# Patient Record
Sex: Female | Born: 1975
Health system: Southern US, Community
[De-identification: ages and names within clinical notes are randomized; demographics above are authoritative.]

## PROBLEM LIST (undated history)

## (undated) DIAGNOSIS — C50919 Malignant neoplasm of unspecified site of unspecified female breast: Secondary | ICD-10-CM

## (undated) DIAGNOSIS — Z8781 Personal history of (healed) traumatic fracture: Secondary | ICD-10-CM

## (undated) DIAGNOSIS — M1990 Unspecified osteoarthritis, unspecified site: Secondary | ICD-10-CM

## (undated) HISTORY — DX: Personal history of (healed) traumatic fracture: Z87.81

## (undated) HISTORY — PX: LEG SURGERY: SHX1003

## (undated) HISTORY — DX: Malignant neoplasm of unspecified site of unspecified female breast: C50.919

## (undated) HISTORY — DX: Unspecified osteoarthritis, unspecified site: M19.90

---

## 2001-11-15 ENCOUNTER — Other Ambulatory Visit: Admission: RE | Admit: 2001-11-15 | Discharge: 2001-11-15 | Payer: Self-pay | Admitting: Obstetrics & Gynecology

## 2002-02-09 ENCOUNTER — Observation Stay (HOSPITAL_COMMUNITY): Admission: RE | Admit: 2002-02-09 | Discharge: 2002-02-10 | Payer: Self-pay | Admitting: Obstetrics & Gynecology

## 2002-05-04 ENCOUNTER — Inpatient Hospital Stay (HOSPITAL_COMMUNITY): Admission: AD | Admit: 2002-05-04 | Discharge: 2002-05-04 | Payer: Self-pay | Admitting: Obstetrics & Gynecology

## 2002-05-09 ENCOUNTER — Inpatient Hospital Stay (HOSPITAL_COMMUNITY): Admission: AD | Admit: 2002-05-09 | Discharge: 2002-05-11 | Payer: Self-pay | Admitting: *Deleted

## 2002-06-28 DIAGNOSIS — Z8781 Personal history of (healed) traumatic fracture: Secondary | ICD-10-CM

## 2002-06-28 HISTORY — DX: Personal history of (healed) traumatic fracture: Z87.81

## 2002-06-30 ENCOUNTER — Encounter: Admission: RE | Admit: 2002-06-30 | Discharge: 2002-07-30 | Payer: Self-pay | Admitting: *Deleted

## 2002-07-31 ENCOUNTER — Encounter: Admission: RE | Admit: 2002-07-31 | Discharge: 2002-08-30 | Payer: Self-pay | Admitting: *Deleted

## 2002-09-29 ENCOUNTER — Encounter: Admission: RE | Admit: 2002-09-29 | Discharge: 2002-10-29 | Payer: Self-pay | Admitting: *Deleted

## 2002-11-29 ENCOUNTER — Encounter: Admission: RE | Admit: 2002-11-29 | Discharge: 2002-12-29 | Payer: Self-pay | Admitting: *Deleted

## 2003-01-29 ENCOUNTER — Encounter: Admission: RE | Admit: 2003-01-29 | Discharge: 2003-02-28 | Payer: Self-pay | Admitting: *Deleted

## 2003-02-04 ENCOUNTER — Encounter: Payer: Self-pay | Admitting: Emergency Medicine

## 2003-02-04 ENCOUNTER — Inpatient Hospital Stay (HOSPITAL_COMMUNITY): Admission: EM | Admit: 2003-02-04 | Discharge: 2003-02-10 | Payer: Self-pay | Admitting: Emergency Medicine

## 2003-02-05 ENCOUNTER — Encounter: Payer: Self-pay | Admitting: Orthopedic Surgery

## 2003-03-01 ENCOUNTER — Encounter: Admission: RE | Admit: 2003-03-01 | Discharge: 2003-03-31 | Payer: Self-pay | Admitting: *Deleted

## 2003-05-01 ENCOUNTER — Encounter: Admission: RE | Admit: 2003-05-01 | Discharge: 2003-05-31 | Payer: Self-pay | Admitting: *Deleted

## 2006-09-10 ENCOUNTER — Emergency Department: Payer: Self-pay | Admitting: Emergency Medicine

## 2006-11-19 ENCOUNTER — Emergency Department: Payer: Self-pay | Admitting: Emergency Medicine

## 2008-08-26 ENCOUNTER — Emergency Department: Payer: Self-pay | Admitting: Emergency Medicine

## 2008-11-14 ENCOUNTER — Emergency Department: Payer: Self-pay | Admitting: Emergency Medicine

## 2009-06-21 ENCOUNTER — Emergency Department: Payer: Self-pay | Admitting: Emergency Medicine

## 2011-06-29 DIAGNOSIS — M1990 Unspecified osteoarthritis, unspecified site: Secondary | ICD-10-CM

## 2011-06-29 HISTORY — DX: Unspecified osteoarthritis, unspecified site: M19.90

## 2011-09-02 ENCOUNTER — Emergency Department: Payer: Self-pay | Admitting: Emergency Medicine

## 2011-09-02 LAB — TROPONIN I: Troponin-I: <0.02 ng/mL

## 2011-09-02 LAB — COMPREHENSIVE METABOLIC PANEL
Albumin: 3.6 g/dL (ref 3.4–5.0)
Alkaline Phosphatase: 82 U/L (ref 50–136)
Anion Gap: 9 (ref 7–16)
Bilirubin,Total: 0.6 mg/dL (ref 0.2–1.0)
Co2: 25 mmol/L (ref 21–32)
Creatinine: 0.86 mg/dL (ref 0.60–1.30)
Osmolality: 281 (ref 275–301)
Potassium: 3.8 mmol/L (ref 3.5–5.1)
Sodium: 142 mmol/L (ref 136–145)

## 2011-09-02 LAB — APTT: Activated PTT: 30.9 secs (ref 23.6–35.9)

## 2011-09-02 LAB — CBC
HGB: 13.5 g/dL (ref 12.0–16.0)
MCH: 29.5 pg (ref 26.0–34.0)
MCHC: 33.2 g/dL (ref 32.0–36.0)
MCV: 89 fL (ref 80–100)
Platelet: 227 10*3/uL (ref 150–440)
RBC: 4.59 10*6/uL (ref 3.80–5.20)

## 2011-09-02 LAB — PROTIME-INR: INR: 1.1

## 2011-09-16 ENCOUNTER — Ambulatory Visit: Payer: Self-pay | Admitting: Family Medicine

## 2012-03-18 ENCOUNTER — Ambulatory Visit: Payer: Self-pay | Admitting: Internal Medicine

## 2012-08-30 ENCOUNTER — Emergency Department: Payer: Self-pay | Admitting: Emergency Medicine

## 2015-04-29 ENCOUNTER — Encounter: Payer: Self-pay | Admitting: Emergency Medicine

## 2015-04-29 ENCOUNTER — Ambulatory Visit
Admission: EM | Admit: 2015-04-29 | Discharge: 2015-04-29 | Disposition: A | Discharge status: Home / Self Care | Payer: Commercial Indemnity | Attending: Family Medicine | Admitting: Family Medicine

## 2015-04-29 DIAGNOSIS — K047 Periapical abscess without sinus: Secondary | ICD-10-CM

## 2015-04-29 DIAGNOSIS — K0889 Other specified disorders of teeth and supporting structures: Secondary | ICD-10-CM

## 2015-04-29 MED ORDER — AMOXICILLIN-POT CLAVULANATE 875-125 MG PO TABS: 1.0000 | ORAL_TABLET | Freq: Two times a day (BID) | ORAL | Status: DC

## 2015-04-29 MED ORDER — TRAMADOL HCL 50 MG PO TABS: 50.0000 mg | ORAL_TABLET | Freq: Four times a day (QID) | ORAL | Status: DC | PRN

## 2015-04-29 MED ORDER — KETOROLAC TROMETHAMINE 60 MG/2ML IM SOLN
60.0000 mg | Freq: Once | INTRAMUSCULAR | Status: AC
  Administered 2015-04-29: 60 mg via INTRAMUSCULAR

## 2015-04-29 MED ORDER — MELOXICAM 15 MG PO TABS: 15.0000 mg | ORAL_TABLET | Freq: Every day | ORAL | Status: DC

## 2015-04-29 NOTE — ED Provider Notes (Signed)
CSN: [id number]     Arrival date & time 04/29/15  0813 History   First MD Initiated Contact with Patient 04/29/15 [phone]    Nurses notes were reviewed. Chief Complaint  Patient presents with  . Dental Pain   patient states she has seen a dentist in a few while  (Consider location/radiation/quality/duration/timing/severity/associated sxs/prior Treatment) Patient is a 39 y.o. female presenting with tooth pain. The history is provided by the patient. No language interpreter was used.  Dental Pain Location:  Lower Lower teeth location:  30/RL 1st molar Quality:  Aching, pulsating and shooting Severity:  Moderate Onset quality:  Sudden Relieved by:  None tried Worsened by:  Touching, cold food/drink and hot food/drink Ineffective treatments:  Acetaminophen Associated symptoms: facial pain, gum swelling and oral lesions   Associated symptoms: no congestion, no difficulty swallowing, no fever, no headaches, no neck pain, no neck swelling and no oral bleeding   Risk factors: no alcohol problem, no diabetes, no periodontal disease and no smoking     History reviewed. No pertinent past medical history. Past Surgical History  Procedure Laterality Date  . Leg surgery Left    History reviewed. No pertinent family history. Social History  Substance Use Topics  . Smoking status: Never Smoker   . Smokeless tobacco: Never Used  . Alcohol Use: Yes   OB History    No data available     Review of Systems  Constitutional: Negative for fever.  HENT: Positive for mouth sores. Negative for congestion.   Musculoskeletal: Negative for neck pain.  Neurological: Negative for headaches.  All other systems reviewed and are negative.   Allergies  Review of patient's allergies indicates no known allergies.  Home Medications   Prior to Admission medications   Medication Sig Start Date End Date Taking? Authorizing Provider  amoxicillin-clavulanate (AUGMENTIN) 875-125 MG tablet Take 1 tablet by  mouth 2 (two) times daily. 04/29/15   Frederich Cha, MD  meloxicam (MOBIC) 15 MG tablet Take 1 tablet (15 mg total) by mouth daily. 04/29/15   Frederich Cha, MD  traMADol (ULTRAM) 50 MG tablet Take 1 tablet (50 mg total) by mouth every 6 (six) hours as needed for moderate pain (may cause sedation if so would only take at night). 04/29/15   Frederich Cha, MD   Meds Ordered and Administered this Visit   Medications  ketorolac (TORADOL) injection 60 mg (60 mg Intramuscular Given 04/29/15 0927)    BP 140/88 mmHg  Pulse 89  Temp(Src) 98.6 F (37 C) (Tympanic)  Resp 16  Ht 5' (1.524 m)  Wt 256 lb (116.121 kg)  BMI 50.00 kg/m2  SpO2 100% No data found.   Physical Exam  Constitutional: She is oriented to person, place, and time. She appears well-developed.  HENT:  Head: Normocephalic and atraumatic.  Mouth/Throat: Oral lesions present. Abnormal dentition. Dental abscesses and dental caries present. No oropharyngeal exudate or posterior oropharyngeal edema.    Eyes: Conjunctivae are normal. Pupils are equal, round, and reactive to light.  Musculoskeletal: Normal range of motion.  Neurological: She is alert and oriented to person, place, and time.  Skin: Skin is warm.  Psychiatric: She has a normal mood and affect.  Vitals reviewed.   ED Course  Procedures (including critical care time)  Labs Review Labs Reviewed - No data to display  Imaging Review No results found.   Visual Acuity Review  Right Eye Distance:   Left Eye Distance:   Bilateral Distance:    Right Eye Near:  Left Eye Near:    Bilateral Near:         MDM   1. Pain, dental   2. Dental infection    For the dental pain is 8 out of 10 now we'll give 60 Toradol IM. Place on Augmentin 875 one tablet twice a day Mobic 15 mg and tramadol needed for pain. 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    Frederich Cha, MD 04/29/15 [phone]

## 2015-04-29 NOTE — Discharge Instructions (Signed)
Dental Care and Dentist Visits Dental care supports good overall health. Regular dental visits can also help you avoid dental pain, bleeding, infection, and other more serious health problems in the future. It is important to keep the mouth healthy because diseases in the teeth, gums, and other oral tissues can spread to other areas of the body. Some problems, such as diabetes, heart disease, and pre-term labor have been associated with poor oral health.  See your dentist every 6 months. If you experience emergency problems such as a toothache or broken tooth, go to the dentist right away. If you see your dentist regularly, you may catch problems early. It is easier to be treated for problems in the early stages.  WHAT TO EXPECT AT A DENTIST VISIT  Your dentist will look for many common oral health problems and recommend proper treatment. At your regular dental visit, you can expect:  Gentle cleaning of the teeth and gums. This includes scraping and polishing. This helps to remove the sticky substance around the teeth and gums (plaque). Plaque forms in the mouth shortly after eating. Over time, plaque hardens on the teeth as tartar. If tartar is not removed regularly, it can cause problems. Cleaning also helps remove stains.  Periodic X-rays. These pictures of the teeth and supporting bone will help your dentist assess the health of your teeth.  Periodic fluoride treatments. Fluoride is a natural mineral shown to help strengthen teeth. Fluoride treatmentinvolves applying a fluoride gel or varnish to the teeth. It is most commonly done in children.  Examination of the mouth, tongue, jaws, teeth, and gums to look for any oral health problems, such as:  Cavities (dental caries). This is decay on the tooth caused by plaque, sugar, and acid in the mouth. It is best to catch a cavity when it is small.  Inflammation of the gums caused by plaque buildup (gingivitis).  Problems with the mouth or malformed  or misaligned teeth.  Oral cancer or other diseases of the soft tissues or jaws. KEEP YOUR TEETH AND GUMS HEALTHY For healthy teeth and gums, follow these general guidelines as well as your dentist's specific advice:  Have your teeth professionally cleaned at the dentist every 6 months.  Brush twice daily with a fluoride toothpaste.  Floss your teeth daily.  Ask your dentist if you need fluoride supplements, treatments, or fluoride toothpaste.  Eat a healthy diet. Reduce foods and drinks with added sugar.  Avoid smoking. TREATMENT FOR ORAL HEALTH PROBLEMS If you have oral health problems, treatment varies depending on the conditions present in your teeth and gums.  Your caregiver will most likely recommend good oral hygiene at each visit.  For cavities, gingivitis, or other oral health disease, your caregiver will perform a procedure to treat the problem. This is typically done at a separate appointment. Sometimes your caregiver will refer you to another dental specialist for specific tooth problems or for surgery. SEEK IMMEDIATE DENTAL CARE IF:  You have pain, bleeding, or soreness in the gum, tooth, jaw, or mouth area.  A permanent tooth becomes loose or separated from the gum socket.  You experience a blow or injury to the mouth or jaw area.   This information is not intended to replace advice given to you by your health care provider. Make sure you discuss any questions you have with your health care provider.   Document Released: 02/24/2011 Document Revised: 09/06/2011 Document Reviewed: 02/24/2011 Elsevier Interactive Patient Education 2016 [place] [facility]  pain may be caused by many things, including:  Tooth decay (cavities or caries). Cavities cause the nerve of your tooth to be open to air and hot or cold temperatures. This can cause pain or discomfort.  Abscess or infection. A dental abscess is an area that is full of infected pus from a  bacterial infection in the inner part of the tooth (pulp). It usually happens at the end of the tooth's root.  Injury.  An unknown reason (idiopathic). Your pain may be mild or severe. It may only happen when:  You are chewing.  You are exposed to hot or cold temperature.  You are eating or drinking sugary foods or beverages, such as:  Soda.  Candy. Your pain may also be there all of the time. HOME CARE Watch your dental pain for any changes. Do these things to lessen your discomfort:  Take medicines only as told by your dentist.  If your dentist tells you to take an antibiotic medicine, finish all of it even if you start to feel better.  Keep all follow-up visits as told by your dentist. This is important.  Do not apply heat to the outside of your face.  Rinse your mouth or gargle with salt water if told by your dentist. This helps with pain and swelling.  You can make salt water by adding  tsp of salt to 1 cup of warm water.  Apply ice to the painful area of your face:  Put ice in a plastic bag.  Place a towel between your skin and the bag.  Leave the ice on for 20 minutes, 2-3 times per day.  Avoid foods or drinks that cause you pain, such as:  Very hot or very cold foods or drinks.  Sweet or sugary foods or drinks. GET HELP IF:  Your pain is not helped with medicines.  Your symptoms are worse.  You have new symptoms. GET HELP RIGHT AWAY IF:  You cannot open your mouth.  You are having trouble breathing or swallowing.  You have a fever.  Your face, neck, or jaw is puffy (swollen).   This information is not intended to replace advice given to you by your health care provider. Make sure you discuss any questions you have with your health care provider.   Document Released: 12/01/2007 Document Revised: 10/29/2014 Document Reviewed: 06/10/2014 Elsevier Interactive Patient Education [employer].

## 2015-04-29 NOTE — ED Notes (Signed)
Patient c/o toothache on her lower left side for the past 2 days.

## 2016-08-26 DIAGNOSIS — C50919 Malignant neoplasm of unspecified site of unspecified female breast: Secondary | ICD-10-CM

## 2016-08-26 HISTORY — DX: Malignant neoplasm of unspecified site of unspecified female breast: C50.919

## 2016-11-02 HISTORY — PX: BREAST LUMPECTOMY: SHX2

## 2017-01-07 ENCOUNTER — Encounter: Payer: Self-pay | Admitting: Internal Medicine

## 2017-01-07 ENCOUNTER — Ambulatory Visit (INDEPENDENT_AMBULATORY_CARE_PROVIDER_SITE_OTHER): Payer: 59 | Admitting: Internal Medicine

## 2017-01-07 DIAGNOSIS — Z8781 Personal history of (healed) traumatic fracture: Secondary | ICD-10-CM | POA: Diagnosis not present

## 2017-01-07 DIAGNOSIS — C50912 Malignant neoplasm of unspecified site of left female breast: Secondary | ICD-10-CM

## 2017-01-07 DIAGNOSIS — R238 Other skin changes: Secondary | ICD-10-CM

## 2017-01-07 MED ORDER — GENTAMICIN SULFATE 0.1 % EX CREA: 1.0000 "application " | TOPICAL_CREAM | Freq: Two times a day (BID) | CUTANEOUS | 0 refills | Status: DC

## 2017-01-07 NOTE — Progress Notes (Signed)
Pre-visit discussion using our clinic review tool. No additional management support is needed unless otherwise documented below in the visit note.  

## 2017-01-07 NOTE — Progress Notes (Signed)
Patient ID: Brittney Nash, female   DOB: 03-08-1976, 41 y.o.   MRN: [id number]   Subjective:    Patient ID: Brittney Nash, female    DOB: [birth year], 41 y.o.   MRN: [id number]  HPI  Patient here to establish care.  Has been diagnosed with breast cancer - 2018.  S/p lumpectomy 10/2016.  S/p XRT to complete next week.  Doing well.  Some fatigue.  Tries to stay active.  No chest pain.  No sob.  No acid reflux.  No abdominal pain.  Bowels moving.  Does have some changes on her skin beneath her left breast - from radiation.  Taking pain medication once a day for this.  Followed by radiation oncology.  Overall she feels she is doing well.  Has one son - age 68.  Just moved to [place].  Regular periods.  LMP 12/07/16.     Past Medical History:  Diagnosis Date  . Arthritis 2013  . Breast cancer ([place]) 08/26/2016  . History of femur fracture 2004   s/p surgery with rod placement   Past Surgical History:  Procedure Laterality Date  . BREAST LUMPECTOMY Left 11/02/2016  . LEG SURGERY Left    Family History  Problem Relation Age of Onset  . Arthritis Mother   . Heart disease Paternal Grandfather    Social History   Social History  . Marital status: Married    Spouse name: N/A  . Number of children: N/A  . Years of education: N/A   Social History Main Topics  . Smoking status: Never Smoker  . Smokeless tobacco: Never Used  . Alcohol use Yes     Comment: occ 1-2 times a month   . Drug use: No  . Sexual activity: Not Asked   Other Topics Concern  . None   Social History Narrative  . None    Outpatient Encounter Prescriptions as of 01/07/2017  Medication Sig  . clotrimazole-betamethasone (LOTRISONE) cream Apply to affected area 2 times daily  . ketoconazole (NIZORAL) 2 % cream Apply topically.  Marland Kitchen oxyCODONE-acetaminophen (PERCOCET/ROXICET) 5-325 MG tablet Take 1 tablet by mouth as needed.  . silver sulfADIAZINE (SILVADENE) 1 % cream Apply to affected area daily  . gentamicin cream  (GARAMYCIN) 0.1 % Apply 1 application topically 2 (two) times daily.  . [DISCONTINUED] amoxicillin-clavulanate (AUGMENTIN) 875-125 MG tablet Take 1 tablet by mouth 2 (two) times daily.  . [DISCONTINUED] meloxicam (MOBIC) 15 MG tablet Take 1 tablet (15 mg total) by mouth daily.  . [DISCONTINUED] traMADol (ULTRAM) 50 MG tablet Take 1 tablet (50 mg total) by mouth every 6 (six) hours as needed for moderate pain (may cause sedation if so would only take at night).   No facility-administered encounter medications on file as of 01/07/2017.     Review of Systems  Constitutional: Negative for appetite change and unexpected weight change.  HENT: Negative for congestion and sinus pressure.   Respiratory: Negative for cough, chest tightness and shortness of breath.   Cardiovascular: Negative for chest pain, palpitations and leg swelling.  Gastrointestinal: Negative for abdominal pain, diarrhea, nausea and vomiting.  Genitourinary: Negative for difficulty urinating and dysuria.  Musculoskeletal: Negative for back pain and joint swelling.  Skin: Negative for rash.       Skin irritation beneath the left breast.    Neurological: Negative for dizziness, light-headedness and headaches.  Psychiatric/Behavioral: Negative for agitation and dysphoric mood.       Objective:     Pulse recheck:  96  Physical Exam  Constitutional: She appears well-developed and well-nourished. No distress.  HENT:  Nose: Nose normal.  Mouth/Throat: Oropharynx is clear and moist.  Neck: Neck supple. No thyromegaly present.  Cardiovascular: Normal rate and regular rhythm.   Pulmonary/Chest: Breath sounds normal. No respiratory distress. She has no wheezes.  Abdominal: Soft. Bowel sounds are normal. There is no tenderness.  Musculoskeletal: She exhibits no edema or tenderness.  Lymphadenopathy:    She has no cervical adenopathy.  Skin: No rash noted. No erythema.  Skin irritation beneath the left breast.    Psychiatric:  She has a normal mood and affect. Her behavior is normal.    BP 110/68 (BP Location: Left Arm, Patient Position: Sitting, Cuff Size: Large)   Pulse (!) 115   Temp 99.1 F (37.3 C) (Oral)   Resp 12   Ht 5' (1.524 m)   Wt 282 lb 6.4 oz (128.1 kg)   SpO2 98%   BMI 55.15 kg/m  Wt Readings from Last 3 Encounters:  01/07/17 282 lb 6.4 oz (128.1 kg)  04/29/15 256 lb (116.1 kg)     Lab Results  Component Value Date   WBC 5.5 09/02/2011   HGB 13.5 09/02/2011   HCT 40.7 09/02/2011   PLT 227 09/02/2011   GLUCOSE 67 09/02/2011   ALT 27 09/02/2011   AST 19 09/02/2011   NA 142 09/02/2011   K 3.8 09/02/2011   CL 108 (H) 09/02/2011   CREATININE 0.86 09/02/2011   BUN 13 09/02/2011   CO2 25 09/02/2011   INR 1.1 09/02/2011       Assessment & Plan:   Problem List Items Addressed This Visit    Breast cancer ([place])    Diagnosed this year.  S/p lumpectomy.  Receiving XRT now.  Will complete next week.  Follow.  Being followed at [facility].        History of femur fracture    Occurred in 2004.  S/p surgery with rod placement.  Reports no residual problems.        Skin irritation    Skin irritation beneath the left breast.  Being followed by radiation oncology.  They are treating.            Einar Pheasant, MD

## 2017-01-09 ENCOUNTER — Encounter: Payer: Self-pay | Admitting: Internal Medicine

## 2017-01-09 DIAGNOSIS — R238 Other skin changes: Secondary | ICD-10-CM | POA: Insufficient documentation

## 2017-01-09 DIAGNOSIS — C50919 Malignant neoplasm of unspecified site of unspecified female breast: Secondary | ICD-10-CM | POA: Insufficient documentation

## 2017-01-09 DIAGNOSIS — Z8781 Personal history of (healed) traumatic fracture: Secondary | ICD-10-CM | POA: Insufficient documentation

## 2017-01-09 NOTE — Assessment & Plan Note (Signed)
Skin irritation beneath the left breast.  Being followed by radiation oncology.  They are treating.

## 2017-01-09 NOTE — Assessment & Plan Note (Signed)
Diagnosed this year.  S/p lumpectomy.  Receiving XRT now.  Will complete next week.  Follow.  Being followed at [facility].

## 2017-01-09 NOTE — Assessment & Plan Note (Signed)
Occurred in 2004.  S/p surgery with rod placement.  Reports no residual problems.

## 2017-02-16 ENCOUNTER — Ambulatory Visit (INDEPENDENT_AMBULATORY_CARE_PROVIDER_SITE_OTHER): Payer: 59 | Admitting: Internal Medicine

## 2017-02-16 ENCOUNTER — Encounter: Payer: Self-pay | Admitting: Internal Medicine

## 2017-02-16 VITALS — BP 110/76 | HR 94 | Temp 98.6°F | Resp 12 | Wt 277.6 lb

## 2017-02-16 DIAGNOSIS — L989 Disorder of the skin and subcutaneous tissue, unspecified: Secondary | ICD-10-CM

## 2017-02-16 DIAGNOSIS — K219 Gastro-esophageal reflux disease without esophagitis: Secondary | ICD-10-CM | POA: Diagnosis not present

## 2017-02-16 DIAGNOSIS — M79671 Pain in right foot: Secondary | ICD-10-CM

## 2017-02-16 DIAGNOSIS — C50912 Malignant neoplasm of unspecified site of left female breast: Secondary | ICD-10-CM | POA: Diagnosis not present

## 2017-02-16 MED ORDER — MUPIROCIN 2 % EX OINT: 1.0000 "application " | TOPICAL_OINTMENT | Freq: Two times a day (BID) | CUTANEOUS | 0 refills | Status: DC

## 2017-02-16 NOTE — Progress Notes (Signed)
Pre-visit discussion using our clinic review tool. No additional management support is needed unless otherwise documented below in the visit note.  

## 2017-02-16 NOTE — Progress Notes (Signed)
Patient ID: Brittney Nash, female   DOB: [birth year], 41 y.o.   MRN: [id number]   Subjective:    Patient ID: Brittney Nash, female    DOB: 09-21-1975, 42 y.o.   MRN: [id number]  HPI  Patient here as a work in appt.  She is concerned regarding a lesion on her right thigh and also some right foot/ankle pain.  She reports that over the last week, she noticed a small circular red lesion on her thigh.  No itching.  The redness has improved.  Scabbed area now.  No pain.  No other lesions.  She also reports noticing some pain in her right foot/ankle.  No injury.  No trauma.  Some popping when rotates her ankle.  No increased swelling.  No significant pain with walking.  She is s/p left lumpectomy for breast cancer.  Just had f/u.  Completed XRT.  Started on tamoxifen.  Just started this week.  States also started having increased acid reflux this week.  Some burning when eats.  Notices more when eating sauce.  No chest pain.  No sob.  No abdominal pain or cramping.  Bowels moving.  Is planning to f/u with PT for left arm lymphedema.     Past Medical History:  Diagnosis Date  . Arthritis 2013  . Breast cancer ([place]) 08/26/2016  . History of femur fracture 2004   s/p surgery with rod placement   Past Surgical History:  Procedure Laterality Date  . BREAST LUMPECTOMY Left 11/02/2016  . LEG SURGERY Left    Family History  Problem Relation Age of Onset  . Arthritis Mother   . Heart disease Paternal Grandfather    Social History   Social History  . Marital status: Married    Spouse name: N/A  . Number of children: N/A  . Years of education: N/A   Social History Main Topics  . Smoking status: Never Smoker  . Smokeless tobacco: Never Used  . Alcohol use Yes     Comment: occ 1-2 times a month   . Drug use: No  . Sexual activity: Not Asked   Other Topics Concern  . None   Social History Narrative  . None    Outpatient Encounter Prescriptions as of 02/16/2017  Medication Sig  .  clotrimazole-betamethasone (LOTRISONE) cream Apply to affected area 2 times daily  . gentamicin cream (GARAMYCIN) 0.1 % Apply 1 application topically 2 (two) times daily.  Marland Kitchen ketoconazole (NIZORAL) 2 % cream Apply topically.  Marland Kitchen oxyCODONE-acetaminophen (PERCOCET/ROXICET) 5-325 MG tablet Take by mouth. PRN  . silver sulfADIAZINE (SILVADENE) 1 % cream Apply to affected area daily  . tamoxifen (NOLVADEX) 20 MG tablet Take 20 mg by mouth.  . mupirocin ointment (BACTROBAN) 2 % Place 1 application into the nose 2 (two) times daily.   No facility-administered encounter medications on file as of 02/16/2017.     Review of Systems  Constitutional: Negative for appetite change and unexpected weight change.  HENT: Negative for congestion and sinus pressure.   Respiratory: Negative for cough, chest tightness and shortness of breath.   Cardiovascular: Negative for chest pain, palpitations and leg swelling.  Gastrointestinal: Negative for abdominal pain, diarrhea, nausea and vomiting.       Some acid reflux as outlined.   Genitourinary: Negative for difficulty urinating and dysuria.  Musculoskeletal: Negative for back pain and joint swelling.       Right foot/ankle pain as outlined.    Skin: Negative for color change and rash.  Circular skin lesion - thigh.   Neurological: Negative for dizziness and headaches.  Psychiatric/Behavioral: Negative for agitation and dysphoric mood.       Objective:    Physical Exam  Constitutional: She appears well-developed and well-nourished. No distress.  HENT:  Nose: Nose normal.  Mouth/Throat: Oropharynx is clear and moist.  Neck: Neck supple. No thyromegaly present.  Cardiovascular: Normal rate and regular rhythm.   Pulmonary/Chest: Breath sounds normal. No respiratory distress. She has no wheezes.  Abdominal: Soft. Bowel sounds are normal. There is no tenderness.  Musculoskeletal: She exhibits no edema or tenderness.  Increased pain - top of right foot.   No increased erythema.  No increased warmth.  No swelling extending up the leg.   Lymphadenopathy:    She has no cervical adenopathy.  Skin: No rash noted. No erythema.  Small slightly raised circular lesion - thigh.  Non tender.  No increased erythema.    Psychiatric: She has a normal mood and affect. Her behavior is normal.    BP 110/76 (BP Location: Left Arm, Patient Position: Sitting, Cuff Size: Large)   Pulse 94   Temp 98.6 F (37 C) (Oral)   Resp 12   Wt 277 lb 9.6 oz (125.9 kg)   SpO2 97%   BMI 54.22 kg/m  Wt Readings from Last 3 Encounters:  02/16/17 277 lb 9.6 oz (125.9 kg)  01/07/17 282 lb 6.4 oz (128.1 kg)  04/29/15 256 lb (116.1 kg)     Lab Results  Component Value Date   WBC 5.5 09/02/2011   HGB 13.5 09/02/2011   HCT 40.7 09/02/2011   PLT 227 09/02/2011   GLUCOSE 67 09/02/2011   ALT 27 09/02/2011   AST 19 09/02/2011   NA 142 09/02/2011   K 3.8 09/02/2011   CL 108 (H) 09/02/2011   CREATININE 0.86 09/02/2011   BUN 13 09/02/2011   CO2 25 09/02/2011   INR 1.1 09/02/2011       Assessment & Plan:   Problem List Items Addressed This Visit    Breast cancer ([place])    S/p lumpectomy.  Completed XRT.  Started on letrozole.  Followed by oncology.        Relevant Medications   tamoxifen (NOLVADEX) 20 MG tablet   GERD (gastroesophageal reflux disease)    Acid reflux as outlined.  Start zantac as directed.  Schedule f/u.  Notify me if persistent.         Other Visit Diagnoses    Skin lesion of right lower extremity    -  Primary   Skin lesion - right thigh.  bactroban as directed.  call with update over the next 1-2 weeks.  if persistent, will refer to dermatology.     Right foot pain       foot/ankle pain as outlined.  laced ankle support.  call with update over the next 1-2 weeks.         Einar Pheasant, MD

## 2017-02-16 NOTE — Patient Instructions (Signed)
Laced ankle support  Take zantac (ranitidine) 150mg  one per day.  Take 30 minutes before breakfast.    Call with update.

## 2017-02-17 ENCOUNTER — Encounter: Payer: Self-pay | Admitting: Internal Medicine

## 2017-02-17 DIAGNOSIS — K219 Gastro-esophageal reflux disease without esophagitis: Secondary | ICD-10-CM | POA: Insufficient documentation

## 2017-02-17 NOTE — Assessment & Plan Note (Signed)
Acid reflux as outlined.  Start zantac as directed.  Schedule f/u.  Notify me if persistent.

## 2017-02-17 NOTE — Assessment & Plan Note (Signed)
S/p lumpectomy.  Completed XRT.  Started on letrozole.  Followed by oncology.

## 2017-03-14 ENCOUNTER — Ambulatory Visit (INDEPENDENT_AMBULATORY_CARE_PROVIDER_SITE_OTHER): Payer: 59

## 2017-03-14 ENCOUNTER — Ambulatory Visit
Admission: EM | Admit: 2017-03-14 | Discharge: 2017-03-14 | Disposition: A | Discharge status: Home / Self Care | Payer: 59 | Attending: Family Medicine | Admitting: Family Medicine

## 2017-03-14 ENCOUNTER — Encounter: Payer: Self-pay | Admitting: *Deleted

## 2017-03-14 DIAGNOSIS — M25571 Pain in right ankle and joints of right foot: Secondary | ICD-10-CM | POA: Diagnosis not present

## 2017-03-14 DIAGNOSIS — M25572 Pain in left ankle and joints of left foot: Secondary | ICD-10-CM

## 2017-03-14 MED ORDER — NAPROXEN 500 MG PO TABS: 500.0000 mg | ORAL_TABLET | Freq: Two times a day (BID) | ORAL | 0 refills | Status: DC

## 2017-03-14 NOTE — ED Triage Notes (Signed)
Patient started having right ankle pain 2 weeks ago. Previous ankle injury reported. Present mechanism of injury unknown.

## 2017-03-14 NOTE — ED Provider Notes (Signed)
[facility]    CSN: [id number] Arrival date & time: 03/14/17  1406     History   Chief Complaint Chief Complaint  Patient presents with  . Ankle Pain    HPI Brittney Nash is a 41 y.o. female.   HPI  This is a 41 year old female who presents with left medial ankle pain that she's had for last 2 weeks. Denied injuring her ankle that she remembers. At injured it remotely several years ago but this healed without any sequelae. Now her pain is over the medial aspect and inferior to the medial malleolus. 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 She does not remember any twisting increase in activities other than standing.         Past Medical History:  Diagnosis Date  . Arthritis 2013  . Breast cancer ([place]) 08/26/2016  . History of femur fracture 2004   s/p surgery with rod placement    Patient Active Problem List   Diagnosis Date Noted  . GERD (gastroesophageal reflux disease) 02/17/2017  . Breast cancer ([place]) 01/09/2017  . Skin irritation 01/09/2017  . History of femur fracture 01/09/2017    Past Surgical History:  Procedure Laterality Date  . BREAST LUMPECTOMY Left 11/02/2016  . LEG SURGERY Left     OB History    No data available       Home Medications    Prior to Admission medications   Medication Sig Start Date End Date Taking? Authorizing Provider  tamoxifen (NOLVADEX) 20 MG tablet Take 20 mg by mouth. 02/10/17 02/10/18 Yes [provider]  clotrimazole-betamethasone (LOTRISONE) cream Apply to affected area 2 times daily 04/07/16 04/07/17  [provider]  gentamicin cream (GARAMYCIN) 0.1 % Apply 1 application topically 2 (two) times daily. 01/07/17   Einar Pheasant, MD  ketoconazole (NIZORAL) 2 % cream Apply topically. 04/07/16 04/07/17  [provider]  mupirocin ointment (BACTROBAN) 2 % Place 1 application into the nose 2 (two)  times daily. 02/16/17   Einar Pheasant, MD  naproxen (NAPROSYN) 500 MG tablet Take 1 tablet (500 mg total) by mouth 2 (two) times daily with a meal. 03/14/17   Lorin Picket, PA-C  oxyCODONE-acetaminophen (PERCOCET/ROXICET) 5-325 MG tablet Take by mouth. PRN    [provider]  silver sulfADIAZINE (SILVADENE) 1 % cream Apply to affected area daily 01/06/17 01/06/18  [provider]    Family History Family History  Problem Relation Age of Onset  . Arthritis Mother   . Heart disease Paternal Grandfather     Social History Social History  Substance Use Topics  . Smoking status: Never Smoker  . Smokeless tobacco: Never Used  . Alcohol use Yes     Comment: occ 1-2 times a month      Allergies   Patient has no known allergies.   Review of Systems Review of Systems  Constitutional: Positive for activity change. Negative for chills, fatigue and fever.  Musculoskeletal: Positive for arthralgias, gait problem and joint swelling.  All other systems reviewed and are negative.    Physical Exam Triage Vital Signs ED Triage Vitals  Enc Vitals Group     BP 03/14/17 1429 124/79     Pulse Rate 03/14/17 1429 84     Resp 03/14/17 1429 16     Temp 03/14/17 1429 98.6 F (37 C)     Temp Source 03/14/17 1429 Oral  SpO2 03/14/17 1429 100 %     Weight 03/14/17 1427 260 lb (117.9 kg)     Height 03/14/17 1427 4\' 11"  (1.499 m)     Head Circumference --      Peak Flow --      Pain Score 03/14/17 1427 4     Pain Loc --      Pain Edu? --      Excl. in [place]? --    No data found.   Updated Vital Signs BP 124/79 (BP Location: Right Arm)   Pulse 84   Temp 98.6 F (37 C) (Oral)   Resp 16   Ht 4\' 11"  (1.499 m)   Wt 260 lb (117.9 kg)   LMP 03/11/2017 (Exact Date) Comment: denies preg  SpO2 100%   BMI 52.51 kg/m   Visual Acuity Right Eye Distance:   Left Eye Distance:   Bilateral Distance:    Right Eye Near:   Left Eye Near:    Bilateral Near:     Physical  Exam  Constitutional: She is oriented to person, place, and time. She appears well-developed and well-nourished. No distress.  HENT:  Head: Normocephalic.  Eyes: Pupils are equal, round, and reactive to light.  Neck: Normal range of motion.  Musculoskeletal: Normal range of motion. She exhibits tenderness. She exhibits no deformity.  Examination of the right ankle shows some mild swelling over the medial ankle. There is no tenderness to compression of the tibia and fibula distally. She has a good range of motion of dorsiflexion plantar flexion and subtalar motion. Maximal tenderness is over the inferior portion of the medial malleolus and also inferior and slightly anterior in the deltoid ligament.  Neurological: She is alert and oriented to person, place, and time.  Skin: Skin is warm and dry. She is not diaphoretic.  Psychiatric: She has a normal mood and affect. Her behavior is normal. Judgment and thought content normal.  Nursing note and vitals reviewed.    UC Treatments / Results  Labs (all labs ordered are listed, but only abnormal results are displayed) Labs Reviewed - No data to display  EKG  EKG Interpretation None       Radiology Dg Ankle Complete Right  Result Date: 03/14/2017 CLINICAL DATA:  Acute right ankle pain without known injury. EXAM: RIGHT ANKLE - COMPLETE 3+ VIEW COMPARISON:  None. FINDINGS: There is no evidence of fracture, dislocation, or joint effusion. There is no evidence of arthropathy or other focal bone abnormality. Soft tissues are unremarkable. IMPRESSION: Normal right ankle. Electronically Signed   By: Marijo Conception, M.D.   On: 03/14/2017 15:08    Procedures Procedures (including critical care time)  Medications Ordered in UC Medications - No data to display   Initial Impression / Assessment and Plan / UC Course  I have reviewed the triage vital signs and the nursing notes.  Pertinent labs & imaging results that were available during my  care of the patient were reviewed by me and considered in my medical decision making (see chart for details).     Plan: 1. Test/x-ray results and diagnosis reviewed with patient 2. rx as per orders; risks, benefits, potential side effects reviewed with patient 3. Recommend supportive treatment with rest elevation and ice as necessary for pain. Use ankle stirrup for protection during active hours may take off at nighttime. Use Naprosyn twice a day with food if you're not improving follow-up with her primary care physician. 4. F/u prn if symptoms worsen  or don't improve   Final Clinical Impressions(s) / UC Diagnoses   Final diagnoses:  Acute left ankle pain    New Prescriptions Discharge Medication List as of 03/14/2017  3:21 PM    START taking these medications   Details  naproxen (NAPROSYN) 500 MG tablet Take 1 tablet (500 mg total) by mouth 2 (two) times daily with a meal., Starting Mon 03/14/2017, Normal         Controlled Substance Prescriptions [place] Controlled Substance Registry consulted? Not Applicable   Lorin Picket, PA-C 03/14/17 1530

## 2017-03-30 ENCOUNTER — Encounter: Payer: Self-pay | Admitting: Internal Medicine

## 2017-03-30 ENCOUNTER — Ambulatory Visit (INDEPENDENT_AMBULATORY_CARE_PROVIDER_SITE_OTHER): Payer: 59 | Admitting: Internal Medicine

## 2017-03-30 VITALS — BP 130/78 | HR 95 | Temp 98.6°F | Resp 14 | Ht 59.0 in | Wt 178.0 lb

## 2017-03-30 DIAGNOSIS — N644 Mastodynia: Secondary | ICD-10-CM | POA: Diagnosis not present

## 2017-03-30 DIAGNOSIS — F439 Reaction to severe stress, unspecified: Secondary | ICD-10-CM | POA: Diagnosis not present

## 2017-03-30 DIAGNOSIS — K219 Gastro-esophageal reflux disease without esophagitis: Secondary | ICD-10-CM | POA: Diagnosis not present

## 2017-03-30 DIAGNOSIS — C50912 Malignant neoplasm of unspecified site of left female breast: Secondary | ICD-10-CM

## 2017-03-30 LAB — CBC WITH DIFFERENTIAL/PLATELET
BASOS ABS: 0 10*3/uL (ref 0.0–0.1)
Basophils Relative: 0.4 % (ref 0.0–3.0)
Eosinophils Absolute: 0.1 10*3/uL (ref 0.0–0.7)
Eosinophils Relative: 1.8 % (ref 0.0–5.0)
HCT: 38.2 % (ref 36.0–46.0)
Hemoglobin: 12.6 g/dL (ref 12.0–15.0)
LYMPHS ABS: 1 10*3/uL (ref 0.7–4.0)
Lymphocytes Relative: 30.3 % (ref 12.0–46.0)
MCHC: 32.9 g/dL (ref 30.0–36.0)
MCV: 89.2 fl (ref 78.0–100.0)
MONO ABS: 0.4 10*3/uL (ref 0.1–1.0)
MONOS PCT: 11.7 % (ref 3.0–12.0)
NEUTROS ABS: 1.8 10*3/uL (ref 1.4–7.7)
NEUTROS PCT: 55.8 % (ref 43.0–77.0)
PLATELETS: 191 10*3/uL (ref 150.0–400.0)
RBC: 4.29 Mil/uL (ref 3.87–5.11)
RDW: 12.9 % (ref 11.5–15.5)
WBC: 3.3 10*3/uL — ABNORMAL LOW (ref 4.0–10.5)

## 2017-03-30 NOTE — Progress Notes (Signed)
Patient ID: Brittney Nash, female   DOB: 06-25-1976, 41 y.o.   MRN: [id number]   Subjective:    Patient ID: Brittney Nash, female    DOB: 05-23-1976, 41 y.o.   MRN: [id number]  HPI  Patient here for a scheduled follow up.  She reports she is doing relatively well.  Her main complaint is that of left breast tenderness and fullness.  States she wore a different bra recently.  Was wearing a support bra, but changed recently.  After this, she noticed some increased leftbreast fullness and tenderness.  Hurts to lie on that side.  No nodule.  No fever.  States feels good.  No joint aches or myalgias.  Increased stress.   Discussed with her today.  Some reactive depression at times.  Discussed medication.  She does not feel needed at this time.  Did request names of counselors.  Given.  No chest pain.  Breathing stable.  Was on zantac.  No acid reflux now.  Off zantac.  No abdominal pain.  Bowels moving.  Was seen for left ankle pain.  Xray negative for fracture.  Wearing a brace.  Takes occasional naprosyn.  Is some better.  Desires no further intervention at this time.     Past Medical History:  Diagnosis Date  . Arthritis 2013  . Breast cancer ([place]) 08/26/2016  . History of femur fracture 2004   s/p surgery with rod placement   Past Surgical History:  Procedure Laterality Date  . BREAST LUMPECTOMY Left 11/02/2016  . LEG SURGERY Left    Family History  Problem Relation Age of Onset  . Arthritis Mother   . Heart disease Paternal Grandfather    Social History   Social History  . Marital status: Married    Spouse name: N/A  . Number of children: N/A  . Years of education: N/A   Social History Main Topics  . Smoking status: Never Smoker  . Smokeless tobacco: Never Used  . Alcohol use Yes     Comment: occ 1-2 times a month   . Drug use: No  . Sexual activity: Not Asked   Other Topics Concern  . None   Social History Narrative  . None    Outpatient Encounter Prescriptions as of  03/30/2017  Medication Sig  . mupirocin ointment (BACTROBAN) 2 % Place 1 application into the nose 2 (two) times daily.  . naproxen (NAPROSYN) 500 MG tablet Take 1 tablet (500 mg total) by mouth 2 (two) times daily with a meal.  . silver sulfADIAZINE (SILVADENE) 1 % cream Apply to affected area daily  . tamoxifen (NOLVADEX) 20 MG tablet Take 20 mg by mouth.  . [DISCONTINUED] clotrimazole-betamethasone (LOTRISONE) cream Apply to affected area 2 times daily  . [DISCONTINUED] gentamicin cream (GARAMYCIN) 0.1 % Apply 1 application topically 2 (two) times daily.  . [DISCONTINUED] ketoconazole (NIZORAL) 2 % cream Apply topically.  . [DISCONTINUED] oxyCODONE-acetaminophen (PERCOCET/ROXICET) 5-325 MG tablet Take by mouth. PRN  . [DISCONTINUED] metroNIDAZOLE (FLAGYL) 500 MG tablet TAKE 1 TABLET (500 MG TOTAL) BY MOUTH TWO (2) TIMES A DAY. FOR 7 DAYS   No facility-administered encounter medications on file as of 03/30/2017.     Review of Systems  Constitutional: Negative for appetite change and unexpected weight change.  HENT: Negative for congestion and sinus pressure.   Respiratory: Negative for cough, chest tightness and shortness of breath.   Cardiovascular: Negative for chest pain, palpitations and leg swelling.  Gastrointestinal: Negative for abdominal pain, diarrhea,  nausea and vomiting.  Genitourinary: Negative for difficulty urinating and dysuria.  Musculoskeletal: Negative for back pain.       Left ankle pain - better.   Skin: Negative for color change and rash.       Left breast fullness and tenderness as outlined.  No redness.   Neurological: Negative for dizziness, light-headedness and headaches.  Psychiatric/Behavioral: Negative for agitation.       Increased stress as outlined.         Objective:    Physical Exam  Constitutional: She appears well-developed and well-nourished. No distress.  HENT:  Nose: Nose normal.  Mouth/Throat: Oropharynx is clear and moist.  Neck: Neck  supple. No thyromegaly present.  Cardiovascular: Normal rate and regular rhythm.   Pulmonary/Chest: Breath sounds normal. No respiratory distress. She has no wheezes.  Breast exam:  No nipple discharge or nipple retraction present.  Increased fullness and tenderness left breast (left upper inner quadrant).  No redness.  No axillary adenopathy.  Abdominal: Soft. Bowel sounds are normal. There is no tenderness.  Musculoskeletal: She exhibits no edema or tenderness.  Lymphadenopathy:    She has no cervical adenopathy.  Skin: No rash noted. No erythema.  Psychiatric: She has a normal mood and affect. Her behavior is normal.    BP 130/78 (BP Location: Left Arm, Patient Position: Sitting, Cuff Size: Large)   Pulse 95   Temp 98.6 F (37 C) (Oral)   Resp 14   Ht 4\' 11"  (1.499 m)   Wt 178 lb (80.7 kg)   LMP 03/11/2017 Comment: denies preg  SpO2 96%   BMI 35.95 kg/m  Wt Readings from Last 3 Encounters:  03/30/17 178 lb (80.7 kg)  03/14/17 260 lb (117.9 kg)  02/16/17 277 lb 9.6 oz (125.9 kg)     Lab Results  Component Value Date   WBC 3.3 (L) 03/30/2017   HGB 12.6 03/30/2017   HCT 38.2 03/30/2017   PLT 191.0 03/30/2017   GLUCOSE 67 09/02/2011   ALT 27 09/02/2011   AST 19 09/02/2011   NA 142 09/02/2011   K 3.8 09/02/2011   CL 108 (H) 09/02/2011   CREATININE 0.86 09/02/2011   BUN 13 09/02/2011   CO2 25 09/02/2011   INR 1.1 09/02/2011    Dg Ankle Complete Right  Result Date: 03/14/2017 CLINICAL DATA:  Acute right ankle pain without known injury. EXAM: RIGHT ANKLE - COMPLETE 3+ VIEW COMPARISON:  None. FINDINGS: There is no evidence of fracture, dislocation, or joint effusion. There is no evidence of arthropathy or other focal bone abnormality. Soft tissues are unremarkable. IMPRESSION: Normal right ankle. Electronically Signed   By: Marijo Conception, M.D.   On: 03/14/2017 15:08       Assessment & Plan:   Problem List Items Addressed This Visit    Breast cancer ([place]) - Primary     S/p lumpectomy.  Completed XRT.  Started on letrozole.  Followed by oncology.        Relevant Orders   CBC with Differential/Platelet (Completed)   Breast pain, left    Left breast fullness and pain as outlined.  Unclear etiology.  Has had some issues with lymphedema.  Wore a different bra.  Continue support bra.  Warm compresses.  Hold abx.  Discussed with her oncology dept.  They will see her tomorrow.        GERD (gastroesophageal reflux disease)    No acid reflux now.  Off zantac.  Stress    Increased stress as outlined.  Discussed with her today.  Names of counselors given.  Discussed medications.  She wants to hold on medication at this time.  Follow closely.            Einar Pheasant, MD

## 2017-03-31 ENCOUNTER — Other Ambulatory Visit: Payer: Self-pay | Admitting: Internal Medicine

## 2017-03-31 ENCOUNTER — Encounter: Payer: Self-pay | Admitting: Internal Medicine

## 2017-03-31 DIAGNOSIS — N644 Mastodynia: Secondary | ICD-10-CM | POA: Insufficient documentation

## 2017-03-31 DIAGNOSIS — D72819 Decreased white blood cell count, unspecified: Secondary | ICD-10-CM

## 2017-03-31 DIAGNOSIS — F439 Reaction to severe stress, unspecified: Secondary | ICD-10-CM | POA: Insufficient documentation

## 2017-03-31 NOTE — Assessment & Plan Note (Signed)
S/p lumpectomy.  Completed XRT.  Started on letrozole.  Followed by oncology.

## 2017-03-31 NOTE — Assessment & Plan Note (Signed)
Left breast fullness and pain as outlined.  Unclear etiology.  Has had some issues with lymphedema.  Wore a different bra.  Continue support bra.  Warm compresses.  Hold abx.  Discussed with her oncology dept.  They will see her tomorrow.

## 2017-03-31 NOTE — Progress Notes (Signed)
Order placed for f/u cbc.   

## 2017-03-31 NOTE — Assessment & Plan Note (Signed)
No acid reflux now.  Off zantac.

## 2017-03-31 NOTE — Assessment & Plan Note (Signed)
Increased stress as outlined.  Discussed with her today.  Names of counselors given.  Discussed medications.  She wants to hold on medication at this time.  Follow closely.

## 2017-04-08 ENCOUNTER — Other Ambulatory Visit: Payer: Self-pay

## 2017-04-26 ENCOUNTER — Other Ambulatory Visit (INDEPENDENT_AMBULATORY_CARE_PROVIDER_SITE_OTHER): Payer: 59

## 2017-04-26 DIAGNOSIS — D72819 Decreased white blood cell count, unspecified: Secondary | ICD-10-CM | POA: Diagnosis not present

## 2017-04-26 LAB — CBC WITH DIFFERENTIAL/PLATELET
BASOS ABS: 0 10*3/uL (ref 0.0–0.1)
Basophils Relative: 0.4 % (ref 0.0–3.0)
Eosinophils Absolute: 0.1 10*3/uL (ref 0.0–0.7)
Eosinophils Relative: 2 % (ref 0.0–5.0)
HCT: 39.4 % (ref 36.0–46.0)
HEMOGLOBIN: 12.8 g/dL (ref 12.0–15.0)
LYMPHS ABS: 1.1 10*3/uL (ref 0.7–4.0)
Lymphocytes Relative: 33.3 % (ref 12.0–46.0)
MCHC: 32.4 g/dL (ref 30.0–36.0)
MCV: 90.3 fl (ref 78.0–100.0)
MONO ABS: 0.4 10*3/uL (ref 0.1–1.0)
MONOS PCT: 11.7 % (ref 3.0–12.0)
NEUTROS PCT: 52.6 % (ref 43.0–77.0)
Neutro Abs: 1.8 10*3/uL (ref 1.4–7.7)
Platelets: 212 10*3/uL (ref 150.0–400.0)
RBC: 4.36 Mil/uL (ref 3.87–5.11)
RDW: 13.1 % (ref 11.5–15.5)
WBC: 3.4 10*3/uL — AB (ref 4.0–10.5)

## 2017-05-09 ENCOUNTER — Telehealth: Payer: Self-pay | Admitting: *Deleted

## 2017-05-09 NOTE — Telephone Encounter (Signed)
Copied from [place] [phone]. Topic: Appointment Scheduling - Scheduling Inquiry for Clinic >> May 09, 2017  3:45 PM Conception Chancy, NT wrote: Reason for CRM: pt is having right ankle pain, and reoccurring headaches she has already been to urgent care and they suggested she see her PCP. Dr. Nicki Reaper is booked out pretty far. She would like to know if she can be squeezed in. Please give pt a call back with an appt date.

## 2017-05-09 NOTE — Telephone Encounter (Signed)
Left message to return call to our office.  To get more information.  

## 2017-05-09 NOTE — Telephone Encounter (Signed)
Right ankle no injury ent to UC one month ago was given a brace and was told was inflammation in her right ankle gave Naproxen to take for pain and swelling. Re-occurring headaches for one week kind a comes and goes relieved with tylenol makes the headache tolerable  But does not get rid of the pain. Scheduled with NP Wednesday at 10:30 am.

## 2017-05-10 NOTE — Telephone Encounter (Signed)
Noted.  Let me know if I need to do anything more.  

## 2017-05-11 ENCOUNTER — Ambulatory Visit (INDEPENDENT_AMBULATORY_CARE_PROVIDER_SITE_OTHER): Payer: 59 | Admitting: Family Medicine

## 2017-05-11 ENCOUNTER — Encounter: Payer: Self-pay | Admitting: Family Medicine

## 2017-05-11 VITALS — BP 102/68 | HR 102 | Temp 98.6°F | Wt 278.4 lb

## 2017-05-11 DIAGNOSIS — M79671 Pain in right foot: Secondary | ICD-10-CM

## 2017-05-11 DIAGNOSIS — G44209 Tension-type headache, unspecified, not intractable: Secondary | ICD-10-CM

## 2017-05-11 MED ORDER — NAPROXEN 500 MG PO TABS: 500.0000 mg | ORAL_TABLET | Freq: Two times a day (BID) | ORAL | 0 refills | Status: DC

## 2017-05-11 NOTE — Patient Instructions (Signed)
Foot Pain Many things can cause foot pain. Some common causes are:  An injury.  A sprain.  Arthritis.  Blisters.  Bunions.  Follow these instructions at home: Pay attention to any changes in your symptoms. Take these actions to help with your discomfort:  If directed, put ice on the affected area: ? Put ice in a plastic bag. ? Place a towel between your skin and the bag. ? Leave the ice on for 15-20 minutes, 3?4 times a day for 2 days.  Take over-the-counter and prescription medicines only as told by your health care provider.  Wear comfortable, supportive shoes that fit you well. Do not wear high heels.  Do not stand or walk for long periods of time.  Do not lift a lot of weight. This can put added pressure on your feet.  Do stretches to relieve foot pain and stiffness as told by your health care provider.  Rub your foot gently.  Keep your feet clean and dry.  Contact a health care provider if:  Your pain does not get better after a few days of self-care.  Your pain gets worse.  You cannot stand on your foot. Get help right away if:  Your foot is numb or tingling.  Your foot or toes are swollen.  Your foot or toes turn white or blue.  You have warmth and redness along your foot. This information is not intended to replace advice given to you by your health care provider. Make sure you discuss any questions you have with your health care provider. Document Released: 07/11/2015 Document Revised: 11/20/2015 Document Reviewed: 07/10/2014 Elsevier Interactive Patient Education  [employer].   It was a pleasure to see you today!  Please elevate foot, use ice 15 minutes on and 15 minutes off in the evening. -continue wearing ankle support brace -naproxen two times daily with food  -supportive shoes should be worn; avoid flat shoes -Follow up if symptoms do not improve with treatment, worsen, or you develop new symptoms  Rest, increase fluids, and you  can use tylenol if needed for headache. If you experience headaches on a regular basis, please follow up for further evaluation and treatment.

## 2017-05-11 NOTE — Progress Notes (Signed)
Subjective:    Patient ID: Brittney Nash, female    DOB: 1975-08-10, 41 y.o.   MRN: [id number]  HPI  This 41 year old female presents today with ankle pain that has been present for 2 weeks.  She was evaluated for right medial ankle pain in the ED on 03/14/17. She reports a remote history of injury to this area that resolved spontaneously without sequale. She stands for extended periods of time with her job and this contributes to her pain. She was advised to use ice, elevation, ankle stirrup, and Naproxen for symptom relief. This  provided benefit. Image of right ankle was unremarkable.  She reports that the daily use of brace has provided benefit and she notes that discomfort occurs after removing it at night. She also reports that intermittent times of not using brace. She wears a slip on dress shoes that have some arch support. Pain is rated as 7 at the worse without a brace and with a brace no more than a 3. This is noted over medial ankle. She reports using naproxen every other day and will often alternate with tylenol/arthritis. This provides benefit for pain. She also states that she has not been consistent with bracing and naproxen use.  She has a PMH of Breast Cancer, arthritis, and history of femur fracture (2004).  Headache that occurs intermittently and is not present today however was present yesterday. She reports that stress is a trigger and she notes that her breast cancer follow up is this week and has increased her stress.  She is also working more with increased stressors noted at work. -Nausea/Vomiting: No -Fever: No -Weight Loss:No -History of CA/immunosuppresion:breast cancer -Visual Changes:No -Seizures: No  -previous treatments: Rest and tylenol improve headache when this occurs -sick contacts/travel/risks: None -Family Hx of headache: No   ROS: Positive for right foot pain. Denies fever, chills, sweats, thunderclap HA, chest pain, SOB, sinus tenderness/pressure,  ear pain. Denies numbness, tingling, or weakness. Denies depressed mood today; reports mild anxiety over upcoming appointment   Past Medical History:  Diagnosis Date  . Arthritis 2013  . Breast cancer ([place]) 08/26/2016  . History of femur fracture 2004   s/p surgery with rod placement     Social History   Socioeconomic History  . Marital status: Married    Spouse name: Not on file  . Number of children: Not on file  . Years of education: Not on file  . Highest education level: Not on file  Social Needs  . Financial resource strain: Not on file  . Food insecurity - worry: Not on file  . Food insecurity - inability: Not on file  . Transportation needs - medical: Not on file  . Transportation needs - non-medical: Not on file  Occupational History  . Not on file  Tobacco Use  . Smoking status: Never Smoker  . Smokeless tobacco: Never Used  Substance and Sexual Activity  . Alcohol use: Yes    Comment: occ 1-2 times a month   . Drug use: No  . Sexual activity: Not on file  Other Topics Concern  . Not on file  Social History Narrative  . Not on file    Past Surgical History:  Procedure Laterality Date  . BREAST LUMPECTOMY Left 11/02/2016  . LEG SURGERY Left     Family History  Problem Relation Age of Onset  . Arthritis Mother   . Heart disease Paternal Grandfather     No Known Allergies  Current Outpatient Medications on File Prior to Visit  Medication Sig Dispense Refill  . tamoxifen (NOLVADEX) 20 MG tablet Take 20 mg by mouth.     No current facility-administered medications on file prior to visit.     BP 102/68   Pulse (!) 102   Temp 98.6 F (37 C) (Oral)   Wt 278 lb 6.4 oz (126.3 kg)   LMP 05/09/2017 (Exact Date)   SpO2 97%   BMI 56.23 kg/m       Objective:   Physical Exam  Constitutional: She is oriented to person, place, and time. She appears well-developed and well-nourished.  Eyes: Pupils are equal, round, and reactive to light. No scleral  icterus.  Neck: Neck supple.  Cardiovascular: Normal rate, regular rhythm and intact distal pulses.  Pulmonary/Chest: Effort normal and breath sounds normal. She has no wheezes. She has no rales.  Abdominal: Soft. Bowel sounds are normal. There is no tenderness. There is no rebound.  Musculoskeletal: Normal range of motion.  Ankle: No visible erythema; mild swelling over medial right ankle. Range of motion is full in all directions. No discomfort noted with dorsiflexion and plantar flexion Strength is 5/5 in all directions. Able to walk without discomfort. She is wearing flip flops during the exam Mild tenderness noted over medial malleolus  Lymphadenopathy:    She has no cervical adenopathy.  Neurological: She is alert and oriented to person, place, and time. She has normal strength.  II-Visual fields grossly intact. III/IV/VI-Extraocular movements intact. Pupils reactive bilaterally. V/VII-Smile symmetric, equal eyebrow raise, facial sensation intact VIII- Hearing grossly intact XI-bilateral shoulder shrug XII-midline tongue extension Motor: 5/5 bilaterally with normal tone and bulk Ambulates with a coordinated gait   Skin: Skin is warm and dry. No rash noted.  Psychiatric: She has a normal mood and affect. Her behavior is normal. Judgment and thought content normal.       Assessment & Plan:  1. Right foot pain Symptom improves when patient is consistent with treatment. Advised consistent use elevation, rest, ice, ankle brace. Naproxen twice daily also provided and advised that she take this with food. Recent imaging was unremarkable. Advised her to follow up in one week if symptoms do not improve with consistent treatment. Further discussed the importance of consistent treatment and supportive footwear. Weight loss also encouraged and alternate periods of sitting/standing.   2. Tension-type headache, not intractable, unspecified chronicity pattern Headache improved and patient  has identified stressors that trigger headache. Neuro exam normal and no alarming factors noted in history or exam. Advised rest, increased fluids, and tylenol as needed. Further advised follow up if she experiences headaches on a regular basis and is treatment more than 10 days in one month; follow up is recommended for treatment.  Delano Metz, FNP-C

## 2017-05-13 ENCOUNTER — Telehealth: Payer: Self-pay | Admitting: Internal Medicine

## 2017-05-13 NOTE — Telephone Encounter (Unsigned)
Copied from [place] [id number]. Topic: Inquiry >> May 13, 2017  2:23 PM Malena Catholic I, NT wrote: Reason for CRM: Pt returning call for Doctor Nicki Reaper Nurse thanks

## 2017-05-13 NOTE — Telephone Encounter (Signed)
Patient called and said that she can not come in on Nov 20. I have scheduled her for 11/27 at 2:00

## 2017-05-13 NOTE — Telephone Encounter (Signed)
Patient was just seen on 11/14. Do you know what she is requesting?

## 2017-05-13 NOTE — Telephone Encounter (Signed)
Copied from [place] [id number]. Topic: Quick Communication - See Telephone Encounter >> May 13, 2017 10:44 AM Bea Graff, NT wrote: CRM for notification. See Telephone encounter for: 05/13/17. Pt will need the rx that Dr. Nicki Reaper had mentioned for anxiety. She uses [facility] in [place].

## 2017-05-13 NOTE — Telephone Encounter (Signed)
I last saw her on 03/30/17.  My note states we discussed medication, but she wanted to hold off.  Given she is having increased problems, I would like to see her.  See if she can come in on Tuesday (05/17/17) at 8:00.  There is a spot.  Can discuss starting medications then.  Confirm pt ok.

## 2017-05-13 NOTE — Telephone Encounter (Signed)
[email or web address].CRM started

## 2017-05-13 NOTE — Telephone Encounter (Signed)
Do not see any mention of medication for anxiety by Dr. Nicki Reaper.

## 2017-05-24 ENCOUNTER — Ambulatory Visit: Payer: 59 | Admitting: Internal Medicine

## 2017-06-01 ENCOUNTER — Telehealth: Payer: Self-pay | Admitting: *Deleted

## 2017-06-01 NOTE — Telephone Encounter (Signed)
Copied from [place] [phone]. Topic: Appointment Scheduling - Scheduling Inquiry for Clinic >> Jun 01, 2017 10:39 AM Aurelio Brash B wrote: Reason for CRM: Pt has an apt 11/27 to discuss anxiety meds with Dr Nicki Reaper, she had to cx the apt,  she called to r/s the apt  Im not seeing anything available until march,  I did not schedule an apt pt wants to get in sooner

## 2017-07-08 ENCOUNTER — Ambulatory Visit: Payer: 59 | Admitting: Internal Medicine

## 2017-07-08 DIAGNOSIS — Z0289 Encounter for other administrative examinations: Secondary | ICD-10-CM

## 2017-08-26 ENCOUNTER — Encounter: Payer: Self-pay | Admitting: Internal Medicine

## 2017-08-26 ENCOUNTER — Ambulatory Visit (INDEPENDENT_AMBULATORY_CARE_PROVIDER_SITE_OTHER): Payer: 59 | Admitting: Internal Medicine

## 2017-08-26 VITALS — BP 122/74 | HR 83 | Temp 98.4°F | Resp 18 | Wt 279.8 lb

## 2017-08-26 DIAGNOSIS — F439 Reaction to severe stress, unspecified: Secondary | ICD-10-CM | POA: Diagnosis not present

## 2017-08-26 DIAGNOSIS — C50912 Malignant neoplasm of unspecified site of left female breast: Secondary | ICD-10-CM

## 2017-08-26 DIAGNOSIS — R5383 Other fatigue: Secondary | ICD-10-CM | POA: Diagnosis not present

## 2017-08-26 DIAGNOSIS — Z13220 Encounter for screening for lipoid disorders: Secondary | ICD-10-CM

## 2017-08-26 LAB — LIPID PANEL
CHOLESTEROL: 146 mg/dL (ref 0–200)
HDL: 51.3 mg/dL (ref >39.00)
LDL CALC: 83 mg/dL (ref 0–99)
NonHDL: 94.45
Total CHOL/HDL Ratio: 3
Triglycerides: 56 mg/dL (ref 0.0–149.0)
VLDL: 11.2 mg/dL (ref 0.0–40.0)

## 2017-08-26 LAB — CBC WITH DIFFERENTIAL/PLATELET
BASOS ABS: 0 10*3/uL (ref 0.0–0.1)
BASOS PCT: 0.9 % (ref 0.0–3.0)
EOS ABS: 0.1 10*3/uL (ref 0.0–0.7)
Eosinophils Relative: 1.8 % (ref 0.0–5.0)
HCT: 37.6 % (ref 36.0–46.0)
Hemoglobin: 12.5 g/dL (ref 12.0–15.0)
Lymphocytes Relative: 30.8 % (ref 12.0–46.0)
Lymphs Abs: 1.2 10*3/uL (ref 0.7–4.0)
MCHC: 33.3 g/dL (ref 30.0–36.0)
MCV: 88.8 fl (ref 78.0–100.0)
MONO ABS: 0.4 10*3/uL (ref 0.1–1.0)
Monocytes Relative: 9.9 % (ref 3.0–12.0)
NEUTROS ABS: 2.2 10*3/uL (ref 1.4–7.7)
Neutrophils Relative %: 56.6 % (ref 43.0–77.0)
PLATELETS: 206 10*3/uL (ref 150.0–400.0)
RBC: 4.23 Mil/uL (ref 3.87–5.11)
RDW: 13.4 % (ref 11.5–15.5)
WBC: 3.9 10*3/uL — ABNORMAL LOW (ref 4.0–10.5)

## 2017-08-26 LAB — COMPREHENSIVE METABOLIC PANEL
ALT: 14 U/L (ref 0–35)
AST: 15 U/L (ref 0–37)
Albumin: 3.7 g/dL (ref 3.5–5.2)
Alkaline Phosphatase: 49 U/L (ref 39–117)
BUN: 11 mg/dL (ref 6–23)
CALCIUM: 9.4 mg/dL (ref 8.4–10.5)
CHLORIDE: 106 meq/L (ref 96–112)
CO2: 25 mEq/L (ref 19–32)
CREATININE: 0.76 mg/dL (ref 0.40–1.20)
GFR: 107.47 mL/min (ref >60.00)
Glucose, Bld: 83 mg/dL (ref 70–99)
Potassium: 4.2 mEq/L (ref 3.5–5.1)
Sodium: 137 mEq/L (ref 135–145)
Total Bilirubin: 0.7 mg/dL (ref 0.2–1.2)
Total Protein: 8 g/dL (ref 6.0–8.3)

## 2017-08-26 LAB — TSH: TSH: 1.79 u[IU]/mL (ref 0.35–4.50)

## 2017-08-26 MED ORDER — CITALOPRAM HYDROBROMIDE 20 MG PO TABS: 20.0000 mg | ORAL_TABLET | Freq: Every day | ORAL | 3 refills | Status: DC
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
## 2017-08-26 NOTE — Patient Instructions (Signed)
Increase citalopram to 20mg  per day.

## 2017-08-28 ENCOUNTER — Encounter: Payer: Self-pay | Admitting: Internal Medicine

## 2017-08-28 NOTE — Assessment & Plan Note (Signed)
Increased stress as outlined.  Discussed with her today.  On citalopram.  Increase citalopram to 20mg  q day.  Follow.

## 2017-08-28 NOTE — Assessment & Plan Note (Signed)
Increased fatigue, daytime somnolence and h/o snoring.  Discussed sleep study.  Order placed for split night sleep study.

## 2017-08-28 NOTE — Assessment & Plan Note (Signed)
S/p lumpectomy.  Completed XRT.  On tamoxifen.  Followed by oncology.

## 2017-09-19 ENCOUNTER — Telehealth: Payer: Self-pay | Admitting: *Deleted

## 2017-09-19 NOTE — Telephone Encounter (Signed)
Copied from [place] [phone]. Topic: Appointment Scheduling - Scheduling Inquiry for Clinic >> Sep 19, 2017 12:19 PM Arletha Grippe wrote: Reason for CRM: pt would like to be seen by Dr Nicki Reaper.  Pt was told by Dr Nicki Reaper to have the office work her in.  Please call [phone] . Pt is having breaking out in a rash and the cream the was prescribed is not helping.

## 2017-09-20 ENCOUNTER — Encounter: Payer: Self-pay | Admitting: Internal Medicine

## 2017-09-20 ENCOUNTER — Ambulatory Visit (INDEPENDENT_AMBULATORY_CARE_PROVIDER_SITE_OTHER): Payer: 59 | Admitting: Internal Medicine

## 2017-09-20 VITALS — BP 130/70 | HR 85 | Temp 98.4°F | Resp 16 | Wt 279.2 lb

## 2017-09-20 DIAGNOSIS — F439 Reaction to severe stress, unspecified: Secondary | ICD-10-CM

## 2017-09-20 DIAGNOSIS — N926 Irregular menstruation, unspecified: Secondary | ICD-10-CM | POA: Diagnosis not present

## 2017-09-20 DIAGNOSIS — C50912 Malignant neoplasm of unspecified site of left female breast: Secondary | ICD-10-CM | POA: Diagnosis not present

## 2017-09-20 DIAGNOSIS — R21 Rash and other nonspecific skin eruption: Secondary | ICD-10-CM

## 2017-09-20 LAB — HCG, QUANTITATIVE, PREGNANCY: Quantitative HCG: 22806 m[IU]/mL

## 2017-09-20 MED ORDER — TRIAMCINOLONE ACETONIDE 0.1 % EX CREA: 1.0000 "application " | TOPICAL_CREAM | Freq: Two times a day (BID) | CUTANEOUS | 0 refills | Status: DC

## 2017-09-20 NOTE — Progress Notes (Signed)
Patient ID: Brittney Nash, female   DOB: 06/08/1976, 42 y.o.   MRN: [id number]   Subjective:    Patient ID: Brittney Nash, female    DOB: 08-31-1975, 42 y.o.   MRN: [id number]  HPI  Patient here as a work in to discuss a persistent rash.  States first noticed rash in 2016-2017.  Was treated.  Resolved. Now has noticed return of rash.  Has been persistent.  Localized only in certain areas.  Circular scaling lesions over upper left leg, buttock and right side.  No facial or neck involvement.  No arm lesions.  No itching.  No fever or chills.  Eating and drinking well.  On citalopram.  Feels this working well for her.  Handling stress.  Mentioned at the end of the visit, she was concerned she may be pregnant.  Had unprotected intercourse.  No period this month.     Past Medical History:  Diagnosis Date  . Arthritis 2013  . Breast cancer ([place]) 08/26/2016  . History of femur fracture 2004   s/p surgery with rod placement   Past Surgical History:  Procedure Laterality Date  . BREAST LUMPECTOMY Left 11/02/2016  . LEG SURGERY Left    Family History  Problem Relation Age of Onset  . Arthritis Mother   . Heart disease Paternal Grandfather    Social History   Socioeconomic History  . Marital status: Married    Spouse name: Not on file  . Number of children: Not on file  . Years of education: Not on file  . Highest education level: Not on file  Occupational History  . Not on file  Social Needs  . Financial resource strain: Not on file  . Food insecurity:    Worry: Not on file    Inability: Not on file  . Transportation needs:    Medical: Not on file    Non-medical: Not on file  Tobacco Use  . Smoking status: Never Smoker  . Smokeless tobacco: Never Used  Substance and Sexual Activity  . Alcohol use: Yes    Comment: occ 1-2 times a month   . Drug use: No  . Sexual activity: Not on file  Lifestyle  . Physical activity:    Days per week: Not on file    Minutes per session: Not  on file  . Stress: Not on file  Relationships  . Social connections:    Talks on phone: Not on file    Gets together: Not on file    Attends religious service: Not on file    Active member of club or organization: Not on file    Attends meetings of clubs or organizations: Not on file    Relationship status: Not on file  Other Topics Concern  . Not on file  Social History Narrative  . Not on file    Outpatient Encounter Medications as of 09/20/2017  Medication Sig  . citalopram (CELEXA) 20 MG tablet Take 1 tablet (20 mg total) by mouth daily.  . tamoxifen (NOLVADEX) 20 MG tablet Take 20 mg by mouth.  . naproxen (NAPROSYN) 500 MG tablet Take 1 tablet (500 mg total) 2 (two) times daily with a meal by mouth. (Patient not taking: Reported on 09/20/2017)  . triamcinolone cream (KENALOG) 0.1 % Apply 1 application topically 2 (two) times daily.  . [DISCONTINUED] clotrimazole-betamethasone (LOTRISONE) cream APPLY TO AFFECTED AREA BID   No facility-administered encounter medications on file as of 09/20/2017.     Review  of Systems  Constitutional: Negative for appetite change and fever.  HENT: Negative for congestion and sinus pressure.   Respiratory: Negative for cough, chest tightness and shortness of breath.   Cardiovascular: Negative for chest pain and leg swelling.  Gastrointestinal: Negative for diarrhea, nausea and vomiting.  Musculoskeletal: Negative for joint swelling and myalgias.  Skin: Positive for rash. Negative for color change.  Neurological: Negative for dizziness and headaches.  Psychiatric/Behavioral: Negative for agitation and dysphoric mood.       Objective:    Physical Exam  Constitutional: She appears well-developed and well-nourished. No distress.  Neck: Neck supple. No thyromegaly present.  Cardiovascular: Normal rate and regular rhythm.  Pulmonary/Chest: Breath sounds normal. No respiratory distress. She has no wheezes.  Abdominal: Soft. Bowel sounds are  normal. There is no tenderness.  Musculoskeletal: She exhibits no edema or tenderness.  Lymphadenopathy:    She has no cervical adenopathy.  Skin: Rash noted. No erythema.  Circular scaling lesions - left buttock and left and right side.    Psychiatric: She has a normal mood and affect. Her behavior is normal.    BP 130/70 (BP Location: Left Arm, Patient Position: Sitting, Cuff Size: Large)   Pulse 85   Temp 98.4 F (36.9 C) (Oral)   Resp 16   Wt 279 lb 3.2 oz (126.6 kg)   SpO2 97%   BMI 56.39 kg/m  Wt Readings from Last 3 Encounters:  09/20/17 279 lb 3.2 oz (126.6 kg)  08/26/17 279 lb 12.8 oz (126.9 kg)  05/11/17 278 lb 6.4 oz (126.3 kg)     Lab Results  Component Value Date   WBC 3.9 (L) 08/26/2017   HGB 12.5 08/26/2017   HCT 37.6 08/26/2017   PLT 206.0 08/26/2017   GLUCOSE 83 08/26/2017   CHOL 146 08/26/2017   TRIG 56.0 08/26/2017   HDL 51.30 08/26/2017   LDLCALC 83 08/26/2017   ALT 14 08/26/2017   AST 15 08/26/2017   NA 137 08/26/2017   K 4.2 08/26/2017   CL 106 08/26/2017   CREATININE 0.76 08/26/2017   BUN 11 08/26/2017   CO2 25 08/26/2017   TSH 1.79 08/26/2017   INR 1.1 09/02/2011    Dg Ankle Complete Right  Result Date: 03/14/2017 CLINICAL DATA:  Acute right ankle pain without known injury. EXAM: RIGHT ANKLE - COMPLETE 3+ VIEW COMPARISON:  None. FINDINGS: There is no evidence of fracture, dislocation, or joint effusion. There is no evidence of arthropathy or other focal bone abnormality. Soft tissues are unremarkable. IMPRESSION: Normal right ankle. Electronically Signed   By: Marijo Conception, M.D.   On: 03/14/2017 15:08       Assessment & Plan:   Problem List Items Addressed This Visit    Breast cancer ([place])    On tamoxifen.  Followed by oncology.        Menstrual irregularity    Concern over possible pregnancy.  Check quantitative HCG.  If pregnant, she prefers referral to discuss options regarding abortion.        Rash - Primary     Persistent rash as outlined.  Trial of TCC .1%.  Refer to dermatology.       Relevant Orders   Ambulatory referral to Dermatology   Stress    Doing well on citalopram.  Follow.        Other Visit Diagnoses    Menstrual changes       Relevant Orders   B-HCG Quant (Completed)  Einar Pheasant, MD

## 2017-09-20 NOTE — Telephone Encounter (Signed)
Patient scheduled with Dr. Nicki Reaper today,

## 2017-09-21 ENCOUNTER — Ambulatory Visit: Payer: 59 | Admitting: Internal Medicine

## 2017-09-21 NOTE — Telephone Encounter (Signed)
Copied from [place] [phone]. Topic: Appointment Scheduling - Scheduling Inquiry for Clinic >> Sep 19, 2017 12:19 PM Arletha Grippe wrote: Reason for CRM: pt would like to be seen by Dr Nicki Reaper.  Pt was told by Dr Nicki Reaper to have the office work her in.  Please call [phone] . Pt is having breaking out in a rash and the cream the was prescribed is not helping.   >> Sep 21, 2017  4:52 PM Cleaster Corin, NT wrote: Pt. Calling stating that Dr. Nicki Reaper has called and pt. Wasn't by her phone. Pt. Calling about cream that hasn't worked for her rash and also stated needing lab results. No crm or notes put in for NT to read results.

## 2017-09-21 NOTE — Telephone Encounter (Signed)
Can you please schedule patient?      Thank you

## 2017-09-22 ENCOUNTER — Other Ambulatory Visit: Payer: Self-pay | Admitting: Internal Medicine

## 2017-09-22 DIAGNOSIS — Z3A01 Less than 8 weeks gestation of pregnancy: Secondary | ICD-10-CM

## 2017-09-22 NOTE — Telephone Encounter (Signed)
Pt has already been seen.Larena Glassman will be calling her to give lab results

## 2017-09-22 NOTE — Progress Notes (Signed)
Order placed for gyn referral.  

## 2017-09-23 ENCOUNTER — Encounter: Payer: Self-pay | Admitting: Internal Medicine

## 2017-09-23 DIAGNOSIS — N926 Irregular menstruation, unspecified: Secondary | ICD-10-CM | POA: Insufficient documentation

## 2017-09-23 DIAGNOSIS — R21 Rash and other nonspecific skin eruption: Secondary | ICD-10-CM | POA: Insufficient documentation

## 2017-09-23 NOTE — Assessment & Plan Note (Signed)
Doing well on citalopram.  Follow.   

## 2017-09-23 NOTE — Assessment & Plan Note (Signed)
Concern over possible pregnancy.  Check quantitative HCG.  If pregnant, she prefers referral to discuss options regarding abortion.

## 2017-09-23 NOTE — Assessment & Plan Note (Signed)
Persistent rash as outlined.  Trial of TCC .1%.  Refer to dermatology.

## 2017-09-23 NOTE — Assessment & Plan Note (Signed)
On tamoxifen.  Followed by oncology.   

## 2017-11-18 ENCOUNTER — Encounter: Payer: Self-pay | Admitting: Internal Medicine

## 2017-11-22 ENCOUNTER — Ambulatory Visit (INDEPENDENT_AMBULATORY_CARE_PROVIDER_SITE_OTHER): Payer: 59 | Admitting: Family Medicine

## 2017-11-22 ENCOUNTER — Encounter: Payer: Self-pay | Admitting: Family Medicine

## 2017-11-22 VITALS — BP 118/84 | HR 87 | Temp 98.7°F | Resp 16 | Wt 287.4 lb

## 2017-11-22 DIAGNOSIS — M5432 Sciatica, left side: Secondary | ICD-10-CM

## 2017-11-22 MED ORDER — CYCLOBENZAPRINE HCL 5 MG PO TABS: 5.0000 mg | ORAL_TABLET | Freq: Three times a day (TID) | ORAL | 0 refills | Status: DC | PRN

## 2017-11-22 MED ORDER — PREDNISONE 10 MG PO TABS: ORAL_TABLET | ORAL | 0 refills | Status: DC
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
## 2017-11-22 NOTE — Patient Instructions (Addendum)
Please take medication as directed and follow up if symptoms are not improving, worsen, or new symptoms develop.   See below for use of ice and heat.  Sciatica Sciatica is pain, numbness, weakness, or tingling along your sciatic nerve. The sciatic nerve starts in the lower back and goes down the back of each leg. Sciatica happens when this nerve is pinched or has pressure put on it. Sciatica usually goes away on its own or with treatment. Sometimes, sciatica may keep coming back (recur). Follow these instructions at home: Medicines  Take over-the-counter and prescription medicines only as told by your doctor.  Do not drive or use heavy machinery while taking prescription pain medicine. Managing pain  If directed, put ice on the affected area. ? Put ice in a plastic bag. ? Place a towel between your skin and the bag. ? Leave the ice on for 20 minutes, 2-3 times a day.  After icing, apply heat to the affected area before you exercise or as often as told by your doctor. Use the heat source that your doctor tells you to use, such as a moist heat pack or a heating pad. ? Place a towel between your skin and the heat source. ? Leave the heat on for 20-30 minutes. ? Remove the heat if your skin turns bright red. This is especially important if you are unable to feel pain, heat, or cold. You may have a greater risk of getting burned. Activity  Return to your normal activities as told by your doctor. Ask your doctor what activities are safe for you. ? Avoid activities that make your sciatica worse.  Take short rests during the day. Rest in a lying or standing position. This is usually better than sitting to rest. ? When you rest for a long time, do some physical activity or stretching between periods of rest. ? Avoid sitting for a long time without moving. Get up and move around at least one time each hour.  Exercise and stretch regularly, as told by your doctor.  Do not lift anything that is  heavier than 10 lb (4.5 kg) while you have symptoms of sciatica. ? Avoid lifting heavy things even when you do not have symptoms. ? Avoid lifting heavy things over and over.  When you lift objects, always lift in a way that is safe for your body. To do this, you should: ? Bend your knees. ? Keep the object close to your body. ? Avoid twisting. General instructions  Use good posture. ? Avoid leaning forward when you are sitting. ? Avoid hunching over when you are standing.  Stay at a healthy weight.  Wear comfortable shoes that support your feet. Avoid wearing high heels.  Avoid sleeping on a mattress that is too soft or too hard. You might have less pain if you sleep on a mattress that is firm enough to support your back.  Keep all follow-up visits as told by your doctor. This is important. Contact a doctor if:  You have pain that: ? Wakes you up when you are sleeping. ? Gets worse when you lie down. ? Is worse than the pain you have had in the past. ? Lasts longer than 4 weeks.  You lose weight for without trying. Get help right away if:  You cannot control when you pee (urinate) or poop (have a bowel movement).  You have weakness in any of these areas and it gets worse. ? Lower back. ? Lower belly (pelvis). ?  Butt (buttocks). ? Legs.  You have redness or swelling of your back.  You have a burning feeling when you pee. This information is not intended to replace advice given to you by your health care provider. Make sure you discuss any questions you have with your health care provider. Document Released: 03/23/2008 Document Revised: 11/20/2015 Document Reviewed: 02/21/2015 Elsevier Interactive Patient Education  [employer].

## 2018-01-01 ENCOUNTER — Other Ambulatory Visit: Payer: Self-pay | Admitting: Family Medicine

## 2018-01-01 DIAGNOSIS — M5432 Sciatica, left side: Secondary | ICD-10-CM

## 2018-01-12 ENCOUNTER — Ambulatory Visit: Payer: 59 | Admitting: Internal Medicine

## 2018-01-24 ENCOUNTER — Encounter: Payer: Self-pay | Admitting: Internal Medicine

## 2018-01-25 NOTE — Telephone Encounter (Signed)
Can we complete this form without an office visit or does she need to be scheduled?

## 2018-01-25 NOTE — Telephone Encounter (Signed)
Please schedule her f/u appt before 02/26/18.  Thanks.

## 2018-01-25 NOTE — Telephone Encounter (Signed)
Pt scheduled  

## 2018-01-25 NOTE — Telephone Encounter (Signed)
LMTCB

## 2018-02-05 ENCOUNTER — Other Ambulatory Visit: Payer: Self-pay | Admitting: Family Medicine

## 2018-02-05 DIAGNOSIS — M5432 Sciatica, left side: Secondary | ICD-10-CM

## 2018-02-06 NOTE — Telephone Encounter (Signed)
Rx request  Patient was seen by Brittney Nash on 11-22-17 for back pain This was prescribed at that visit 11-22-17 Patient has appt with Dr. Nicki Reaper on 02-10-18

## 2018-02-08 MED ORDER — CYCLOBENZAPRINE HCL 5 MG PO TABS: 5.0000 mg | ORAL_TABLET | Freq: Every day | ORAL | 0 refills | Status: DC | PRN

## 2018-02-08 NOTE — Telephone Encounter (Signed)
I have sent in the prescription for flexeril #30 with no refills.  Please notify pt that if she continues to have problems, can refer to PT or can reevaluate.  Just let us know.

## 2018-02-08 NOTE — Telephone Encounter (Signed)
LMTCB

## 2018-02-08 NOTE — Telephone Encounter (Signed)
OK to send in refill? Patient has an appt with you on 02/10/18

## 2018-02-08 NOTE — Telephone Encounter (Signed)
Need to clarify if she still needs and how often she is taking.

## 2018-02-08 NOTE — Telephone Encounter (Signed)
Pt is only taking at night as needed. She does not take it every night. She has had the script since May and just recently ran out.

## 2018-02-09 NOTE — Telephone Encounter (Signed)
Left message for patient to call back if needed, let her know that rx was refilled she is coming in tomorrow

## 2018-02-10 ENCOUNTER — Ambulatory Visit (INDEPENDENT_AMBULATORY_CARE_PROVIDER_SITE_OTHER): Payer: 59 | Admitting: Internal Medicine

## 2018-02-10 ENCOUNTER — Encounter: Payer: Self-pay | Admitting: Internal Medicine

## 2018-02-10 VITALS — BP 112/80 | HR 108 | Temp 98.7°F | Ht 59.0 in | Wt 287.6 lb

## 2018-02-10 DIAGNOSIS — F439 Reaction to severe stress, unspecified: Secondary | ICD-10-CM

## 2018-02-10 DIAGNOSIS — Z0289 Encounter for other administrative examinations: Secondary | ICD-10-CM

## 2018-02-10 DIAGNOSIS — M5432 Sciatica, left side: Secondary | ICD-10-CM

## 2018-02-10 DIAGNOSIS — C50912 Malignant neoplasm of unspecified site of left female breast: Secondary | ICD-10-CM

## 2018-02-10 NOTE — Progress Notes (Signed)
Patient ID: Brittney Nash, female   DOB: 1976-03-03, 42 y.o.   MRN: [id number]   Subjective:    Patient ID: Brittney Nash, female    DOB: [birth year], 42 y.o.   MRN: [id number]  HPI  Patient here for a scheduled follow up.  Also wants form completed.  Has been having problems with left sciatic pain.  Started PT yesterday.  Tries to stay ctive.  No chest pain.  No sob.  No acid reflux.  No abdominal pain.  Bowels moving.  Due to f/u at [facility] 03/02/18 - regarding her history of breast cancer.  Overall she feels she is handling stress relatively well.  On celexa.  Does not feel needs any further intervention.     Past Medical History:  Diagnosis Date  . Arthritis 2013  . Breast cancer ([place]) 08/26/2016  . History of femur fracture 2004   s/p surgery with rod placement   Past Surgical History:  Procedure Laterality Date  . BREAST LUMPECTOMY Left 11/02/2016  . LEG SURGERY Left    Family History  Problem Relation Age of Onset  . Arthritis Mother   . Heart disease Paternal Grandfather    Social History   Socioeconomic History  . Marital status: Married    Spouse name: Not on file  . Number of children: Not on file  . Years of education: Not on file  . Highest education level: Not on file  Occupational History  . Not on file  Social Needs  . Financial resource strain: Not on file  . Food insecurity:    Worry: Not on file    Inability: Not on file  . Transportation needs:    Medical: Not on file    Non-medical: Not on file  Tobacco Use  . Smoking status: Never Smoker  . Smokeless tobacco: Never Used  Substance and Sexual Activity  . Alcohol use: Yes    Comment: occ 1-2 times a month   . Drug use: No  . Sexual activity: Not on file  Lifestyle  . Physical activity:    Days per week: Not on file    Minutes per session: Not on file  . Stress: Not on file  Relationships  . Social connections:    Talks on phone: Not on file    Gets together: Not on file    Attends religious  service: Not on file    Active member of club or organization: Not on file    Attends meetings of clubs or organizations: Not on file    Relationship status: Not on file  Other Topics Concern  . Not on file  Social History Narrative  . Not on file    Outpatient Encounter Medications as of 02/10/2018  Medication Sig  . citalopram (CELEXA) 20 MG tablet Take 1 tablet (20 mg total) by mouth daily.  . cyclobenzaprine (FLEXERIL) 5 MG tablet Take 1 tablet (5 mg total) by mouth daily as needed for muscle spasms.  . [EXPIRED] tamoxifen (NOLVADEX) 20 MG tablet Take 20 mg by mouth.  . [DISCONTINUED] triamcinolone cream (KENALOG) 0.1 % Apply 1 application topically 2 (two) times daily.  . predniSONE (DELTASONE) 10 MG tablet Take 4 tablets once daily for 2 days, 3 tabs daily for 2 days, 2 tabs daily for 2 days, 1 tab daily for 2 days. (Patient not taking: Reported on 02/10/2018)   No facility-administered encounter medications on file as of 02/10/2018.     Review of Systems  Constitutional: Negative  for appetite change and unexpected weight change.  HENT: Negative for congestion and sinus pressure.   Respiratory: Negative for cough, chest tightness and shortness of breath.   Cardiovascular: Negative for chest pain, palpitations and leg swelling.  Gastrointestinal: Negative for abdominal pain, diarrhea, nausea and vomiting.  Genitourinary: Negative for difficulty urinating and dysuria.  Musculoskeletal: Negative for joint swelling and myalgias.  Skin: Negative for color change and rash.  Neurological: Negative for dizziness, light-headedness and headaches.  Psychiatric/Behavioral: Negative for agitation and dysphoric mood.       Objective:    Physical Exam  Constitutional: She appears well-developed and well-nourished. No distress.  HENT:  Nose: Nose normal.  Mouth/Throat: Oropharynx is clear and moist.  Neck: Neck supple. No thyromegaly present.  Cardiovascular: Normal rate and regular  rhythm.  Pulmonary/Chest: Breath sounds normal. No respiratory distress. She has no wheezes.  Abdominal: Soft. Bowel sounds are normal. There is no tenderness.  Musculoskeletal: She exhibits no edema or tenderness.  Lymphadenopathy:    She has no cervical adenopathy.  Skin: No rash noted. No erythema.  Psychiatric: She has a normal mood and affect. Her behavior is normal.    BP 112/80   Pulse (!) 108   Temp 98.7 F (37.1 C) (Oral)   Ht 4\' 11"  (1.499 m)   Wt 287 lb 9.6 oz (130.5 kg)   LMP 02/09/2018 (Exact Date)   SpO2 97%   BMI 58.09 kg/m  Wt Readings from Last 3 Encounters:  02/10/18 287 lb 9.6 oz (130.5 kg)  11/22/17 287 lb 6 oz (130.4 kg)  09/20/17 279 lb 3.2 oz (126.6 kg)     Lab Results  Component Value Date   WBC 3.9 (L) 08/26/2017   HGB 12.5 08/26/2017   HCT 37.6 08/26/2017   PLT 206.0 08/26/2017   GLUCOSE 83 08/26/2017   CHOL 146 08/26/2017   TRIG 56.0 08/26/2017   HDL 51.30 08/26/2017   LDLCALC 83 08/26/2017   ALT 14 08/26/2017   AST 15 08/26/2017   NA 137 08/26/2017   K 4.2 08/26/2017   CL 106 08/26/2017   CREATININE 0.76 08/26/2017   BUN 11 08/26/2017   CO2 25 08/26/2017   TSH 1.79 08/26/2017   INR 1.1 09/02/2011    Dg Ankle Complete Right  Result Date: 03/14/2017 CLINICAL DATA:  Acute right ankle pain without known injury. EXAM: RIGHT ANKLE - COMPLETE 3+ VIEW COMPARISON:  None. FINDINGS: There is no evidence of fracture, dislocation, or joint effusion. There is no evidence of arthropathy or other focal bone abnormality. Soft tissues are unremarkable. IMPRESSION: Normal right ankle. Electronically Signed   By: Marijo Conception, M.D.   On: 03/14/2017 15:08       Assessment & Plan:   Problem List Items Addressed This Visit    Breast cancer ([place])    On tamoxifen.  Followed by oncology.        Sciatica    Just started therapy.  Follow.        Stress    Discussed with her today.  Feels she is doing well on citalopram.  Follow.  Does not feel  needs any further intervention.         Other Visit Diagnoses    Encounter for completion of form with patient    -  Primary   Biometric screening form completed.         Einar Pheasant, MD

## 2018-02-15 ENCOUNTER — Other Ambulatory Visit: Payer: Self-pay | Admitting: Internal Medicine

## 2018-02-16 MED ORDER — TRIAMCINOLONE ACETONIDE 0.1 % EX CREA: 1.0000 "application " | TOPICAL_CREAM | Freq: Two times a day (BID) | CUTANEOUS | 0 refills | Status: DC

## 2018-02-19 ENCOUNTER — Encounter: Payer: Self-pay | Admitting: Internal Medicine

## 2018-02-19 DIAGNOSIS — M5416 Radiculopathy, lumbar region: Secondary | ICD-10-CM | POA: Insufficient documentation

## 2018-02-19 NOTE — Assessment & Plan Note (Signed)
Just started therapy.  Follow.

## 2018-02-19 NOTE — Assessment & Plan Note (Signed)
Discussed with her today.  Feels she is doing well on citalopram.  Follow.  Does not feel needs any further intervention.

## 2018-02-19 NOTE — Assessment & Plan Note (Signed)
On tamoxifen.  Followed by oncology.   

## 2018-03-17 ENCOUNTER — Ambulatory Visit: Payer: 59 | Admitting: Internal Medicine

## 2018-03-28 ENCOUNTER — Encounter: Payer: Self-pay | Admitting: Internal Medicine

## 2018-03-28 ENCOUNTER — Ambulatory Visit (INDEPENDENT_AMBULATORY_CARE_PROVIDER_SITE_OTHER): Payer: 59 | Admitting: Internal Medicine

## 2018-03-28 VITALS — BP 122/70 | HR 98 | Temp 98.2°F | Resp 18 | Wt 282.8 lb

## 2018-03-28 DIAGNOSIS — R21 Rash and other nonspecific skin eruption: Secondary | ICD-10-CM

## 2018-03-28 DIAGNOSIS — M5432 Sciatica, left side: Secondary | ICD-10-CM

## 2018-03-28 MED ORDER — CLOTRIMAZOLE-BETAMETHASONE 1-0.05 % EX CREA: 1.0000 "application " | TOPICAL_CREAM | Freq: Two times a day (BID) | CUTANEOUS | 0 refills | Status: DC

## 2018-03-28 MED ORDER — CYCLOBENZAPRINE HCL 5 MG PO TABS: 5.0000 mg | ORAL_TABLET | Freq: Every day | ORAL | 0 refills | Status: DC | PRN

## 2018-03-28 NOTE — Progress Notes (Signed)
Patient ID: KARLEA MCKIBBIN, female   DOB: 21-May-1976, 42 y.o.   MRN: [id number]   Subjective:    Patient ID: MADELLYN DENIO, female    DOB: [birth year], 42 y.o.   MRN: [id number]  HPI  Patient here as a work in with concerns regarding a rash.  Has had intermittent rash.  Has worsened over the last 2 weeks.  Located over her stomach and back.  Small circular scaly areas.  No fever.  No joint aches.  Has also noticed a raised area on her back.  No redness.  Has tried steroid cream.  Has appt with dermatology, but is not until November.  Would like earlier appt.     Past Medical History:  Diagnosis Date  . Arthritis 2013  . Breast cancer ([place]) 08/26/2016  . History of femur fracture 2004   s/p surgery with rod placement   Past Surgical History:  Procedure Laterality Date  . BREAST LUMPECTOMY Left 11/02/2016  . LEG SURGERY Left    Family History  Problem Relation Age of Onset  . Arthritis Mother   . Heart disease Paternal Grandfather    Social History   Socioeconomic History  . Marital status: Single    Spouse name: Not on file  . Number of children: Not on file  . Years of education: Not on file  . Highest education level: Not on file  Occupational History  . Not on file  Social Needs  . Financial resource strain: Not on file  . Food insecurity:    Worry: Not on file    Inability: Not on file  . Transportation needs:    Medical: Not on file    Non-medical: Not on file  Tobacco Use  . Smoking status: Never Smoker  . Smokeless tobacco: Never Used  Substance and Sexual Activity  . Alcohol use: Yes    Comment: occ 1-2 times a month   . Drug use: No  . Sexual activity: Not on file  Lifestyle  . Physical activity:    Days per week: Not on file    Minutes per session: Not on file  . Stress: Not on file  Relationships  . Social connections:    Talks on phone: Not on file    Gets together: Not on file    Attends religious service: Not on file    Active member of club or  organization: Not on file    Attends meetings of clubs or organizations: Not on file    Relationship status: Not on file  Other Topics Concern  . Not on file  Social History Narrative  . Not on file    Outpatient Encounter Medications as of 03/28/2018  Medication Sig  . citalopram (CELEXA) 20 MG tablet Take 1 tablet (20 mg total) by mouth daily.  . clotrimazole-betamethasone (LOTRISONE) cream Apply 1 application topically 2 (two) times daily.  . cyclobenzaprine (FLEXERIL) 5 MG tablet Take 1 tablet (5 mg total) by mouth daily as needed for muscle spasms.  . [DISCONTINUED] cyclobenzaprine (FLEXERIL) 5 MG tablet Take 1 tablet (5 mg total) by mouth daily as needed for muscle spasms.  . [DISCONTINUED] predniSONE (DELTASONE) 10 MG tablet Take 4 tablets once daily for 2 days, 3 tabs daily for 2 days, 2 tabs daily for 2 days, 1 tab daily for 2 days. (Patient not taking: Reported on 02/10/2018)  . [DISCONTINUED] triamcinolone cream (KENALOG) 0.1 % Apply 1 application topically 2 (two) times daily.   No facility-administered encounter  medications on file as of 03/28/2018.     Review of Systems  Constitutional: Negative for appetite change and unexpected weight change.  Respiratory: Negative for cough and shortness of breath.   Cardiovascular: Negative for leg swelling.  Gastrointestinal: Negative for diarrhea, nausea and vomiting.  Musculoskeletal: Negative for joint swelling and myalgias.  Skin: Positive for rash.  Neurological: Negative for dizziness and headaches.  Psychiatric/Behavioral: Negative for agitation and dysphoric mood.       Objective:    Physical Exam  Constitutional: She appears well-developed and well-nourished. No distress.  Neck: Neck supple.  Cardiovascular: Normal rate and regular rhythm.  Pulmonary/Chest: Breath sounds normal. No respiratory distress. She has no wheezes.  Lymphadenopathy:    She has no cervical adenopathy.  Skin:  Circular scaly patches over the  abdomen, back and chest.  Raised lesion - back.  No increased erythema.    Psychiatric: She has a normal mood and affect. Her behavior is normal.    BP 122/70 (BP Location: Left Arm, Patient Position: Sitting, Cuff Size: Large)   Pulse 98   Temp 98.2 F (36.8 C) (Oral)   Resp 18   Wt 282 lb 12.8 oz (128.3 kg)   SpO2 96%   BMI 57.12 kg/m  Wt Readings from Last 3 Encounters:  03/28/18 282 lb 12.8 oz (128.3 kg)  02/10/18 287 lb 9.6 oz (130.5 kg)  11/22/17 287 lb 6 oz (130.4 kg)     Lab Results  Component Value Date   WBC 3.9 (L) 08/26/2017   HGB 12.5 08/26/2017   HCT 37.6 08/26/2017   PLT 206.0 08/26/2017   GLUCOSE 83 08/26/2017   CHOL 146 08/26/2017   TRIG 56.0 08/26/2017   HDL 51.30 08/26/2017   LDLCALC 83 08/26/2017   ALT 14 08/26/2017   AST 15 08/26/2017   NA 137 08/26/2017   K 4.2 08/26/2017   CL 106 08/26/2017   CREATININE 0.76 08/26/2017   BUN 11 08/26/2017   CO2 25 08/26/2017   TSH 1.79 08/26/2017   INR 1.1 09/02/2011        Assessment & Plan:   Problem List Items Addressed This Visit    Rash - Primary    Persistent rash as outlined.  lotrisone cream as outlined.  See if can get an earlier appt with dermatology.  Raised - cyst - back.  Warm compresses.  Follow.        Relevant Orders   Ambulatory referral to Dermatology   Sciatica   Relevant Medications   cyclobenzaprine (FLEXERIL) 5 MG tablet       Einar Pheasant, MD

## 2018-04-03 ENCOUNTER — Encounter: Payer: Self-pay | Admitting: Internal Medicine

## 2018-04-03 NOTE — Assessment & Plan Note (Signed)
Persistent rash as outlined.  lotrisone cream as outlined.  See if can get an earlier appt with dermatology.  Raised - cyst - back.  Warm compresses.  Follow.

## 2018-04-23 ENCOUNTER — Other Ambulatory Visit: Payer: Self-pay | Admitting: Internal Medicine

## 2018-04-30 ENCOUNTER — Encounter: Payer: Self-pay | Admitting: Internal Medicine

## 2018-05-02 NOTE — Telephone Encounter (Signed)
LMTCB

## 2018-05-02 NOTE — Telephone Encounter (Signed)
Pt has been seen for this issue previously. She says she is wondering if there is something she can do for it at home or if she should see a specialist

## 2018-05-02 NOTE — Telephone Encounter (Signed)
Please call and notify pt that I can refer her to ortho if persistent problems.  Has been seen previously and treated with prednisone and muscle relaxer.  If persistent issue, can refer for further evaluation.  Let me know if has preference of which ortho she wants to see.

## 2018-05-03 ENCOUNTER — Telehealth: Payer: Self-pay

## 2018-05-03 DIAGNOSIS — M5432 Sciatica, left side: Secondary | ICD-10-CM

## 2018-05-03 NOTE — Telephone Encounter (Signed)
Copied from [place] [phone] [phone]. Topic: General - Other >> May 03, 2018  2:11 PM Ivar Drape wrote: Reason for CRM:   Patient was given Dr. Bary Leriche message and she stated she would like another prescription for a muscle relaxer and see what that does before she goes to see an Ortho.  She would like that prescription sent to the [facility] in [place].  Patient does have an Ortho in mind if it comes to that.

## 2018-05-04 MED ORDER — CYCLOBENZAPRINE HCL 5 MG PO TABS: 5.0000 mg | ORAL_TABLET | Freq: Every day | ORAL | 0 refills | Status: DC | PRN

## 2018-05-04 NOTE — Addendum Note (Signed)
Addended by: Alisa Graff on: 05/04/2018 01:14 PM   Modules accepted: Orders

## 2018-05-04 NOTE — Telephone Encounter (Signed)
This is regarding the my chart message patient sent earlier in the week

## 2018-05-04 NOTE — Telephone Encounter (Signed)
Left detailed msg for pt

## 2018-05-04 NOTE — Telephone Encounter (Signed)
rx for flexeril sent in to pharmacy.  Let us know if persistent problems.  Do not take and drive or operate machinery.

## 2018-07-18 ENCOUNTER — Other Ambulatory Visit: Payer: Self-pay | Admitting: Acute Care

## 2018-07-18 DIAGNOSIS — M545 Low back pain, unspecified: Secondary | ICD-10-CM

## 2018-07-26 ENCOUNTER — Other Ambulatory Visit: Payer: Self-pay | Admitting: Internal Medicine

## 2018-07-26 DIAGNOSIS — M5432 Sciatica, left side: Secondary | ICD-10-CM

## 2018-07-27 MED ORDER — CYCLOBENZAPRINE HCL 5 MG PO TABS: 5.0000 mg | ORAL_TABLET | Freq: Every day | ORAL | 0 refills | Status: DC | PRN

## 2018-07-31 ENCOUNTER — Encounter: Payer: Self-pay | Admitting: Internal Medicine

## 2018-08-01 ENCOUNTER — Ambulatory Visit
Admission: RE | Admit: 2018-08-01 | Discharge: 2018-08-01 | Disposition: A | Discharge status: Home / Self Care | Payer: 59 | Source: Ambulatory Visit | Attending: Acute Care | Admitting: Acute Care

## 2018-08-01 DIAGNOSIS — M545 Low back pain, unspecified: Secondary | ICD-10-CM

## 2018-08-03 ENCOUNTER — Telehealth: Payer: Self-pay | Admitting: Internal Medicine

## 2018-08-03 DIAGNOSIS — N949 Unspecified condition associated with female genital organs and menstrual cycle: Secondary | ICD-10-CM

## 2018-08-03 DIAGNOSIS — Z853 Personal history of malignant neoplasm of breast: Secondary | ICD-10-CM

## 2018-08-03 NOTE — Telephone Encounter (Signed)
Please call and notify pt that I received MRI results from [place].  The MRI revealed what appears to be a pelvic cyst.  Needs a pelvic ultrasound to better evaluate.  I would like to refer her to gyn for further evaluation and pelvic ultrasound.  If agreeable, let me know and I will place order for the referral.  Also, let me know if she has a preference of which gyn she wants to see.

## 2018-08-03 NOTE — Telephone Encounter (Signed)
Copied from [place] [phone]. Topic: Quick Communication - See Telephone Encounter >> Aug 03, 2018  3:26 PM Margot Ables wrote: CRM for notification. See Telephone encounter for: 08/03/18.  Brittney Nash with [facility] called stating MRI was done at [facility] and found a pelvic cyst. They are recommending that Dr. Nicki Reaper contact pt for f/u and repeat MRI. Please advise.  Pt saw Brittney Herb, NP [facility]  [address] [place], [place] [address]  Office: [phone]

## 2018-08-04 NOTE — Telephone Encounter (Signed)
LMTCB

## 2018-08-04 NOTE — Telephone Encounter (Signed)
Pt agreeable to referral and ultrasound. She does not have a preference which gyn.

## 2018-08-07 NOTE — Telephone Encounter (Signed)
Left detailed message for patient.

## 2018-08-07 NOTE — Telephone Encounter (Signed)
Order placed for referral to gyn.  Someone should be contacting her with appt date and time.

## 2018-08-11 ENCOUNTER — Encounter: Payer: Self-pay | Admitting: Internal Medicine

## 2018-08-11 NOTE — Telephone Encounter (Signed)
Patient has not had labs since 08/2017. Ok to schedule for a fasting lab appt prior to her appt.

## 2018-08-17 ENCOUNTER — Encounter: Payer: Self-pay | Admitting: Internal Medicine

## 2018-08-17 ENCOUNTER — Ambulatory Visit (INDEPENDENT_AMBULATORY_CARE_PROVIDER_SITE_OTHER): Payer: 59 | Admitting: Internal Medicine

## 2018-08-17 VITALS — BP 126/72 | HR 97 | Temp 98.5°F | Resp 16 | Ht 59.0 in | Wt 292.0 lb

## 2018-08-17 DIAGNOSIS — Z13220 Encounter for screening for lipoid disorders: Secondary | ICD-10-CM

## 2018-08-17 DIAGNOSIS — M5416 Radiculopathy, lumbar region: Secondary | ICD-10-CM

## 2018-08-17 DIAGNOSIS — R21 Rash and other nonspecific skin eruption: Secondary | ICD-10-CM

## 2018-08-17 DIAGNOSIS — Z0000 Encounter for general adult medical examination without abnormal findings: Secondary | ICD-10-CM

## 2018-08-17 DIAGNOSIS — C50912 Malignant neoplasm of unspecified site of left female breast: Secondary | ICD-10-CM | POA: Diagnosis not present

## 2018-08-17 DIAGNOSIS — F439 Reaction to severe stress, unspecified: Secondary | ICD-10-CM

## 2018-08-17 DIAGNOSIS — D72819 Decreased white blood cell count, unspecified: Secondary | ICD-10-CM | POA: Diagnosis not present

## 2018-08-17 DIAGNOSIS — N949 Unspecified condition associated with female genital organs and menstrual cycle: Secondary | ICD-10-CM

## 2018-08-17 LAB — CBC WITH DIFFERENTIAL/PLATELET
BASOS ABS: 0.1 10*3/uL (ref 0.0–0.1)
BASOS PCT: 1.2 % (ref 0.0–3.0)
EOS ABS: 0.1 10*3/uL (ref 0.0–0.7)
Eosinophils Relative: 2.2 % (ref 0.0–5.0)
HEMATOCRIT: 36.7 % (ref 36.0–46.0)
HEMOGLOBIN: 12.1 g/dL (ref 12.0–15.0)
LYMPHS PCT: 30.6 % (ref 12.0–46.0)
Lymphs Abs: 1.3 10*3/uL (ref 0.7–4.0)
MCHC: 33.1 g/dL (ref 30.0–36.0)
MCV: 87.7 fl (ref 78.0–100.0)
MONO ABS: 0.4 10*3/uL (ref 0.1–1.0)
Monocytes Relative: 9.4 % (ref 3.0–12.0)
Neutro Abs: 2.5 10*3/uL (ref 1.4–7.7)
Neutrophils Relative %: 56.6 % (ref 43.0–77.0)
Platelets: 214 10*3/uL (ref 150.0–400.0)
RBC: 4.18 Mil/uL (ref 3.87–5.11)
RDW: 13.6 % (ref 11.5–15.5)
WBC: 4.4 10*3/uL (ref 4.0–10.5)

## 2018-08-17 LAB — LIPID PANEL
Cholesterol: 135 mg/dL (ref 0–200)
HDL: 50.4 mg/dL (ref >39.00)
LDL CALC: 72 mg/dL (ref 0–99)
NonHDL: 85.09
Total CHOL/HDL Ratio: 3
Triglycerides: 67 mg/dL (ref 0.0–149.0)
VLDL: 13.4 mg/dL (ref 0.0–40.0)

## 2018-08-17 LAB — COMPREHENSIVE METABOLIC PANEL
ALBUMIN: 4 g/dL (ref 3.5–5.2)
ALT: 12 U/L (ref 0–35)
AST: 16 U/L (ref 0–37)
Alkaline Phosphatase: 51 U/L (ref 39–117)
BILIRUBIN TOTAL: 0.7 mg/dL (ref 0.2–1.2)
BUN: 13 mg/dL (ref 6–23)
CHLORIDE: 105 meq/L (ref 96–112)
CO2: 24 mEq/L (ref 19–32)
CREATININE: 0.74 mg/dL (ref 0.40–1.20)
Calcium: 9.1 mg/dL (ref 8.4–10.5)
GFR: 103.79 mL/min (ref >60.00)
Glucose, Bld: 74 mg/dL (ref 70–99)
Potassium: 4 mEq/L (ref 3.5–5.1)
SODIUM: 137 meq/L (ref 135–145)
Total Protein: 7.6 g/dL (ref 6.0–8.3)

## 2018-08-17 LAB — TSH: TSH: 1.74 u[IU]/mL (ref 0.35–4.50)

## 2018-08-17 NOTE — Progress Notes (Signed)
Patient ID: Brittney Nash, female   DOB: 02/20/1976, 43 y.o.   MRN: [id number]   Subjective:    Patient ID: Brittney Nash, female    DOB: 01-28-1976, 43 y.o.   MRN: [id number]  HPI  Patient here for her physical exam.   She reports she is doing relatively well.  Saw Dr Ouida Sills yesterday.  Had pap smear.  Is planning for pelvic ultrasound to f/u on left adnexal cyst noted on MRI.  She has been seeing Dr Sherlynn Carbon Meeler for f/u low back pain and left hip and leg pain.  Planning for [facility] - scheduled for 09/05/18.  Saw dermatology for persistent rash.  Diagnosed with eczema/psoriasis.  Had punch biopsy.  Needs suture removed.  No chest pain.  Trying to stay active.  No sob.  No acid reflux.  No abdominal pain.  Bowels moving.  Handling stress.     Past Medical History:  Diagnosis Date  . Arthritis 2013  . Breast cancer ([place]) 08/26/2016  . History of femur fracture 2004   s/p surgery with rod placement   Past Surgical History:  Procedure Laterality Date  . BREAST LUMPECTOMY Left 11/02/2016  . LEG SURGERY Left    Family History  Problem Relation Age of Onset  . Arthritis Mother   . Heart disease Paternal Grandfather    Social History   Socioeconomic History  . Marital status: Single    Spouse name: Not on file  . Number of children: Not on file  . Years of education: Not on file  . Highest education level: Not on file  Occupational History  . Not on file  Social Needs  . Financial resource strain: Not on file  . Food insecurity:    Worry: Not on file    Inability: Not on file  . Transportation needs:    Medical: Not on file    Non-medical: Not on file  Tobacco Use  . Smoking status: Never Smoker  . Smokeless tobacco: Never Used  Substance and Sexual Activity  . Alcohol use: Yes    Comment: occ 1-2 times a month   . Drug use: No  . Sexual activity: Not on file  Lifestyle  . Physical activity:    Days per week: Not on file    Minutes per session: Not on file    . Stress: Not on file  Relationships  . Social connections:    Talks on phone: Not on file    Gets together: Not on file    Attends religious service: Not on file    Active member of club or organization: Not on file    Attends meetings of clubs or organizations: Not on file    Relationship status: Not on file  Other Topics Concern  . Not on file  Social History Narrative  . Not on file    Outpatient Encounter Medications as of 08/17/2018  Medication Sig  . citalopram (CELEXA) 20 MG tablet TAKE 1 TABLET(20 MG) BY MOUTH DAILY  . clotrimazole-betamethasone (LOTRISONE) cream Apply 1 application topically 2 (two) times daily.  . cyclobenzaprine (FLEXERIL) 5 MG tablet Take 1 tablet (5 mg total) by mouth daily as needed for muscle spasms.  . fluconazole (DIFLUCAN) 150 MG tablet    No facility-administered encounter medications on file as of 08/17/2018.     Review of Systems  Constitutional: Negative for appetite change and unexpected weight change.  HENT: Negative for congestion and sinus pressure.   Eyes: Negative for pain  and visual disturbance.  Respiratory: Negative for cough, chest tightness and shortness of breath.   Cardiovascular: Negative for chest pain, palpitations and leg swelling.  Gastrointestinal: Negative for abdominal pain, diarrhea, nausea and vomiting.  Genitourinary: Negative for difficulty urinating and dysuria.  Musculoskeletal: Negative for joint swelling and myalgias.       Low back and left hip and left leg pain.    Skin: Negative for color change.       Rash better.   Neurological: Negative for dizziness, light-headedness and headaches.  Hematological: Negative for adenopathy. Does not bruise/bleed easily.  Psychiatric/Behavioral: Negative for agitation and dysphoric mood.       Objective:    Physical Exam Constitutional:      General: She is not in acute distress.    Appearance: Normal appearance. She is well-developed.  HENT:     Nose: Nose  normal. No congestion.     Mouth/Throat:     Pharynx: No oropharyngeal exudate or posterior oropharyngeal erythema.  Eyes:     General: No scleral icterus.       Right eye: No discharge.        Left eye: No discharge.  Neck:     Musculoskeletal: Neck supple. No muscular tenderness.     Thyroid: No thyromegaly.  Cardiovascular:     Rate and Rhythm: Normal rate and regular rhythm.  Pulmonary:     Effort: No tachypnea, accessory muscle usage or respiratory distress.     Breath sounds: Normal breath sounds. No decreased breath sounds or wheezing.  Chest:     Breasts:        Right: No inverted nipple, mass, nipple discharge or tenderness (no axillary adenopathy).        Left: No inverted nipple, mass, nipple discharge or tenderness (no axilarry adenopathy).  Abdominal:     General: Bowel sounds are normal.     Palpations: Abdomen is soft.     Tenderness: There is no abdominal tenderness.  Musculoskeletal:        General: No swelling or tenderness.  Lymphadenopathy:     Cervical: No cervical adenopathy.  Skin:    Findings: No erythema or rash.  Neurological:     Mental Status: She is alert and oriented to person, place, and time.  Psychiatric:        Mood and Affect: Mood normal.        Behavior: Behavior normal.     BP 126/72   Pulse 97   Temp 98.5 F (36.9 C) (Oral)   Resp 16   Ht 4\' 11"  (1.499 m)   Wt 292 lb (132.5 kg)   SpO2 99%   BMI 58.98 kg/m  Wt Readings from Last 3 Encounters:  08/17/18 292 lb (132.5 kg)  03/28/18 282 lb 12.8 oz (128.3 kg)  02/10/18 287 lb 9.6 oz (130.5 kg)     Lab Results  Component Value Date   WBC 4.4 08/17/2018   HGB 12.1 08/17/2018   HCT 36.7 08/17/2018   PLT 214.0 08/17/2018   GLUCOSE 74 08/17/2018   CHOL 135 08/17/2018   TRIG 67.0 08/17/2018   HDL 50.40 08/17/2018   LDLCALC 72 08/17/2018   ALT 12 08/17/2018   AST 16 08/17/2018   NA 137 08/17/2018   K 4.0 08/17/2018   CL 105 08/17/2018   CREATININE 0.74 08/17/2018   BUN  13 08/17/2018   CO2 24 08/17/2018   TSH 1.74 08/17/2018   INR 1.1 09/02/2011    Mr Lumbar  Spine Wo Contrast  Result Date: 08/01/2018 CLINICAL DATA:  Low back pain radiating into the left leg to the foot. EXAM: MRI LUMBAR SPINE WITHOUT CONTRAST TECHNIQUE: Multiplanar, multisequence MR imaging of the lumbar spine was performed. No intravenous contrast was administered. COMPARISON:  None. FINDINGS: Segmentation: Assumed standard. The last well-formed disc space is designated L5-S1 for the purposes of this report. Alignment: Accentuated lumbar lordosis. Trace anterolisthesis at L5-S1. Vertebrae:  No fracture, evidence of discitis, or bone lesion. Conus medullaris and cauda equina: Conus extends to the L1 level. Conus and cauda equina appear normal. Paraspinal and other soft tissues: Incompletely visualized left adnexal cyst measuring at least 3.1 cm. Disc levels: T11-T12: Disc desiccation with minimal bulging.  No stenosis. T12-L1:  Negative. L1-L2:  Negative. L2-L3:  Negative. L3-L4:  Disc desiccation with minimal bulging.  No stenosis. L4-L5:  Negative. L5-S1: Disc desiccation with shallow broad-based posterior disc protrusion. Moderate bilateral facet arthropathy with prominent right facet joint effusion. Moderate bilateral neuroforaminal stenosis. No spinal canal stenosis. IMPRESSION: 1. Degenerative changes at L5-S1 with moderate bilateral neuroforaminal stenosis. Moderate facet arthropathy at this level with prominent right facet joint effusion that could reflect instability. 2. Incompletely visualized probably benign left adnexal cyst measuring at least 3.1 cm. Recommend follow-up pelvic ultrasound in 6-12 weeks. This recommendation follows ACR consensus guidelines: White Paper of the ACR Incidental Findings Committee II on Adnexal Findings. J Am Coll Radiol [phone]. Electronically Signed   By: Titus Dubin M.D.   On: 08/01/2018 16:13       Assessment & Plan:   Problem List Items Addressed  This Visit    Adnexal cyst    Left adnexal cyst noted on MRI.  Saw gyn.  Scheduled for pelvic ultrasound.        Breast cancer ([place]) - Primary    On tamoxifen.  Followed by oncology.        Relevant Medications   fluconazole (DIFLUCAN) 150 MG tablet   Other Relevant Orders   Comprehensive metabolic panel (Completed)   TSH (Completed)   Healthcare maintenance    Physical today 08/17/18.  PAP 08/16/18 - Dr Ouida Sills.  Mammogram 12/02/17 - [facility] II.        Lumbar radiculitis    Low back pain and pain in left hip and leg.  Seeing Dr Sherlynn Carbon Meeler.  Scheduled for [facility] 09/05/18.        Rash    Saw dermatology.  Diagnosed with eczema/psoriasis.  Suture removed without problems.  No erythema.        Stress    On citalopram and doing well.  Follow.         Other Visit Diagnoses    Screening cholesterol level       Relevant Orders   Lipid panel (Completed)   Leukopenia, unspecified type       Relevant Orders   CBC with Differential/Platelet (Completed)       Einar Pheasant, MD

## 2018-08-18 ENCOUNTER — Encounter: Payer: Self-pay | Admitting: Internal Medicine

## 2018-08-20 ENCOUNTER — Encounter: Payer: Self-pay | Admitting: Internal Medicine

## 2018-08-20 DIAGNOSIS — Z0000 Encounter for general adult medical examination without abnormal findings: Secondary | ICD-10-CM | POA: Insufficient documentation

## 2018-08-20 DIAGNOSIS — N949 Unspecified condition associated with female genital organs and menstrual cycle: Secondary | ICD-10-CM | POA: Insufficient documentation

## 2018-08-20 NOTE — Assessment & Plan Note (Signed)
On citalopram and doing well.  Follow.

## 2018-08-20 NOTE — Assessment & Plan Note (Signed)
Physical today 08/17/18.  PAP 08/16/18 - Dr Ouida Sills.  Mammogram 12/02/17 - [facility] II.

## 2018-08-20 NOTE — Assessment & Plan Note (Signed)
Low back pain and pain in left hip and leg.  Seeing Dr Sherlynn Carbon Meeler.  Scheduled for [facility] 09/05/18.

## 2018-08-20 NOTE — Assessment & Plan Note (Signed)
On tamoxifen.  Followed by oncology.   

## 2018-08-20 NOTE — Assessment & Plan Note (Signed)
Left adnexal cyst noted on MRI.  Saw gyn.  Scheduled for pelvic ultrasound.

## 2018-08-20 NOTE — Assessment & Plan Note (Signed)
Saw dermatology.  Diagnosed with eczema/psoriasis.  Suture removed without problems.  No erythema.

## 2018-08-24 ENCOUNTER — Other Ambulatory Visit: Payer: Self-pay | Admitting: Internal Medicine

## 2018-08-24 DIAGNOSIS — M5432 Sciatica, left side: Secondary | ICD-10-CM

## 2018-08-28 MED ORDER — CYCLOBENZAPRINE HCL 5 MG PO TABS: 5.0000 mg | ORAL_TABLET | Freq: Every day | ORAL | 0 refills | Status: DC | PRN

## 2018-09-06 ENCOUNTER — Encounter: Payer: Self-pay | Admitting: Internal Medicine

## 2018-09-07 ENCOUNTER — Other Ambulatory Visit: Payer: Self-pay

## 2018-09-07 NOTE — Telephone Encounter (Signed)
Please call pt and confirm only medication is citalopram.  Per our discussion, she just saw Dr Ouida Sills.  I would like for her to give him a call today and let him know she is pregnant and on citalopram (to see if he wants to change the medication).  Have her call us back and let us know what he says.  If any problems getting in touch with him or if no response, let me know.

## 2018-09-07 NOTE — Telephone Encounter (Signed)
LMTCB

## 2018-09-07 NOTE — Telephone Encounter (Signed)
Pt has contacted GYN and has an appointment on Thursday. She is going to call Dr Ouida Sills this afternoon and hold meds in the am. She is going to let me know if she does not hear anything back by mid morning tomorrow. She is taking tamoxifen, citalopram, and gabapentin.

## 2018-09-11 ENCOUNTER — Telehealth: Payer: Self-pay

## 2018-09-11 ENCOUNTER — Encounter: Payer: Self-pay | Admitting: Internal Medicine

## 2018-09-11 NOTE — Telephone Encounter (Signed)
Spoke with patient. She was letting me know that she has stopped taking her medication and has appt with GYN on 3/19. They did not have a sooner appt for her. Advised pt to keep [place] updated.

## 2018-09-11 NOTE — Telephone Encounter (Signed)
Copied from [place] [id number]. Topic: General - Other >> Sep 11, 2018  2:21 PM [name] wrote: Reason for CRM:   See MyChart message from 09/06/2018.  Pt calling back states she needs to speak with [place]. Pt can be reached at home number

## 2018-09-19 NOTE — Telephone Encounter (Signed)
Pt is going to call OB. She says that she did see OBGYN but is being referred back to [facility] with Dr. Marolyn Hammock since she has a history of breast cancer. Dr. Marolyn Hammock wanted her to have an OBGYN at [facility] so they could monitor closely. She should know by the end of the day or tomorrow who she will be seeing at [facility].

## 2018-09-19 NOTE — Telephone Encounter (Signed)
Please call pt and make sure she is in touch with her OB regarding medication question.  Pepcid should be ok, but I would her ask her OB before taking to make sure.

## 2018-10-18 ENCOUNTER — Other Ambulatory Visit: Payer: Self-pay | Admitting: Internal Medicine

## 2018-10-18 DIAGNOSIS — M5432 Sciatica, left side: Secondary | ICD-10-CM

## 2018-10-18 NOTE — Telephone Encounter (Signed)
Please confirm with pt if still needs flexeril.  Is she having increased pain/problems?  I do not mind refilling, just need a little more information.

## 2018-10-18 NOTE — Telephone Encounter (Signed)
Last OV 08/17/2018  Last refilled 08/28/2018 disp 30 with no refills   Next OV 02/15/2019  Sent to PCP for approval

## 2018-10-20 NOTE — Telephone Encounter (Signed)
Pt is going to call GYN

## 2018-10-21 ENCOUNTER — Encounter: Payer: Self-pay | Admitting: Internal Medicine

## 2019-02-15 ENCOUNTER — Ambulatory Visit: Payer: 59 | Admitting: Internal Medicine

## 2019-04-09 MED ORDER — MAGNESIUM SULFATE 20 GM/500ML IV SOLN
1.00 | INTRAVENOUS | Status: DC
Start: ? — End: 2019-04-09

## 2019-04-09 MED ORDER — DIPHENHYDRAMINE HCL 25 MG PO CAPS
25.00 | ORAL_CAPSULE | ORAL | Status: DC
Start: ? — End: 2019-04-09

## 2019-04-09 MED ORDER — LACTATED RINGERS IV SOLN
100.00 | INTRAVENOUS | Status: DC
Start: ? — End: 2019-04-09

## 2019-04-09 MED ORDER — ACETAMINOPHEN 325 MG PO TABS
650.00 | ORAL_TABLET | ORAL | Status: DC
Start: ? — End: 2019-04-09

## 2019-04-09 MED ORDER — ONDANSETRON 4 MG PO TBDP
4.00 | ORAL_TABLET | ORAL | Status: DC
Start: ? — End: 2019-04-09

## 2019-04-09 MED ORDER — PNV PRENATAL PLUS MULTIVITAMIN 27-1 MG PO TABS
1.00 | ORAL_TABLET | ORAL | Status: DC
Start: 2019-04-10 — End: 2019-04-09

## 2019-04-09 MED ORDER — SENNOSIDES 8.6 MG PO TABS
1.00 | ORAL_TABLET | ORAL | Status: DC
Start: ? — End: 2019-04-09

## 2019-04-09 MED ORDER — POLYETHYLENE GLYCOL 3350 17 G PO PACK
17.00 | PACK | ORAL | Status: DC
Start: ? — End: 2019-04-09

## 2019-04-09 MED ORDER — ENOXAPARIN SODIUM 40 MG/0.4ML ~~LOC~~ SOLN
40.00 | SUBCUTANEOUS | Status: DC
Start: 2019-04-09 — End: 2019-04-09

## 2019-04-09 MED ORDER — SIMETHICONE 80 MG PO CHEW
80.00 | CHEWABLE_TABLET | ORAL | Status: DC
Start: ? — End: 2019-04-09

## 2019-04-09 MED ORDER — GENERIC EXTERNAL MEDICATION
Status: DC
Start: ? — End: 2019-04-09

## 2019-04-09 MED ORDER — DOCUSATE SODIUM 100 MG PO CAPS
100.00 | ORAL_CAPSULE | ORAL | Status: DC
Start: 2019-04-09 — End: 2019-04-09

## 2019-04-09 MED ORDER — IBUPROFEN 600 MG PO TABS
600.00 | ORAL_TABLET | ORAL | Status: DC
Start: ? — End: 2019-04-09

## 2019-04-09 MED ORDER — DIBUCAINE 1 % EX OINT
1.00 | TOPICAL_OINTMENT | CUTANEOUS | Status: DC
Start: ? — End: 2019-04-09

## 2019-04-09 MED ORDER — HYDROCORTISONE 2.5 % EX CREA
1.00 | TOPICAL_CREAM | CUTANEOUS | Status: DC
Start: ? — End: 2019-04-09

## 2019-06-12 ENCOUNTER — Telehealth: Payer: Self-pay | Admitting: Internal Medicine

## 2019-06-12 NOTE — Telephone Encounter (Signed)
I have left pt two voicemail's to call back and give Doxy info

## 2019-06-13 NOTE — Telephone Encounter (Signed)
Pt set up for doxy

## 2019-06-15 ENCOUNTER — Other Ambulatory Visit: Payer: Self-pay

## 2019-06-15 ENCOUNTER — Ambulatory Visit (INDEPENDENT_AMBULATORY_CARE_PROVIDER_SITE_OTHER): Payer: 59 | Admitting: Internal Medicine

## 2019-06-15 DIAGNOSIS — C50912 Malignant neoplasm of unspecified site of left female breast: Secondary | ICD-10-CM | POA: Diagnosis not present

## 2019-06-15 DIAGNOSIS — G5601 Carpal tunnel syndrome, right upper limb: Secondary | ICD-10-CM | POA: Diagnosis not present

## 2019-06-15 DIAGNOSIS — F439 Reaction to severe stress, unspecified: Secondary | ICD-10-CM

## 2019-06-15 NOTE — Progress Notes (Signed)
Patient ID: Brittney Nash, female   DOB: 1975/12/22, 43 y.o.   MRN: [id number]   Virtual Visit via video Note  This visit type was conducted due to national recommendations for restrictions regarding the COVID-19 pandemic (e.g. social distancing).  This format is felt to be most appropriate for this patient at this time.  All issues noted in this document were discussed and addressed.  No physical exam was performed (except for noted visual exam findings with Video Visits).   I connected with Brittney Nash by a video enabled telemedicine application or telephone and verified that I am speaking with the correct person using two identifiers. Location patient: home Location provider: work  Persons participating in the virtual visit: patient, provider  I discussed the limitations, risks, security and privacy concerns of performing an evaluation and management service by video and the availability of in person appointments. The patient expressed understanding and agreed to proceed.   Reason for visit: scheduled follow up.    HPI: She reports she is doing well.  Recently had a baby girl - Brittney Nash.  Doing well.  No increased stress. At home now.  Planning to return to work 06/2019.  Had pre eclampsia.  Blood pressure doing well now.  No chest pain or sob reported.  No abdominal pain or bowel change reported. Does report problems with her right hand/thumb - CTS.  Some numbness. Splint helps some.  Discussed wearing at night while she sleeps.  Some right shoulder discomfort. Notices more in am. Better as up and starts moving - resolves.  Good rom.  Overall feels things are going well.     ROS: See pertinent positives and negatives per HPI.  Past Medical History:  Diagnosis Date  . Arthritis 2013  . Breast cancer ([place]) 08/26/2016  . History of femur fracture 2004   s/p surgery with rod placement    Past Surgical History:  Procedure Laterality Date  . BREAST LUMPECTOMY Left 11/02/2016  . LEG  SURGERY Left     Family History  Problem Relation Age of Onset  . Arthritis Mother   . Heart disease Paternal Grandfather     SOCIAL HX: reviewed.    Current Outpatient Medications:  .  citalopram (CELEXA) 20 MG tablet, TAKE 1 TABLET(20 MG) BY MOUTH DAILY, Disp: 30 tablet, Rfl: 0 .  clotrimazole-betamethasone (LOTRISONE) cream, Apply 1 application topically 2 (two) times daily., Disp: 60 g, Rfl: 0 .  cyclobenzaprine (FLEXERIL) 5 MG tablet, Take 1 tablet (5 mg total) by mouth daily as needed for muscle spasms., Disp: 30 tablet, Rfl: 0 .  fluconazole (DIFLUCAN) 150 MG tablet, , Disp: , Rfl:  .  gabapentin (NEURONTIN) 300 MG capsule, Take 300 mg by mouth daily., Disp: , Rfl:  .  tamoxifen (NOLVADEX) 20 MG tablet, Take 20 mg by mouth daily., Disp: , Rfl:   EXAM:  VITALS per patient if applicable: [id number]  GENERAL: alert, oriented, appears well and in no acute distress  HEENT: atraumatic, conjunttiva clear, no obvious abnormalities on inspection of external nose and ears  NECK: normal movements of the head and neck  LUNGS: on inspection no signs of respiratory distress, breathing rate appears normal, no obvious gross SOB, gasping or wheezing  CV: no obvious cyanosis  PSYCH/NEURO: pleasant and cooperative, no obvious depression or anxiety, speech and thought processing grossly intact  ASSESSMENT AND PLAN:  Discussed the following assessment and plan:  Breast cancer ([place]) Followed by oncology.  Apparently planning to restart tamoxifen.  Stress On citalopram and doing well.  Follow.    CTS (carpal tunnel syndrome) Discussed wearing splint at night.  Follow.  Update me over the next several weeks  If persistent problems, will require further evaluation.     I discussed the assessment and treatment plan with the patient. The patient was provided an opportunity to ask questions and all were answered. The patient agreed with the plan and demonstrated an understanding of the  instructions.   The patient was advised to call back or seek an in-person evaluation if the symptoms worsen or if the condition fails to improve as anticipated.   Einar Pheasant, MD

## 2019-06-23 ENCOUNTER — Encounter: Payer: Self-pay | Admitting: Internal Medicine

## 2019-06-23 DIAGNOSIS — G5600 Carpal tunnel syndrome, unspecified upper limb: Secondary | ICD-10-CM | POA: Insufficient documentation

## 2019-06-23 NOTE — Assessment & Plan Note (Signed)
Followed by oncology.  Apparently planning to restart tamoxifen.

## 2019-06-23 NOTE — Assessment & Plan Note (Signed)
On citalopram and doing well.  Follow.

## 2019-06-23 NOTE — Assessment & Plan Note (Signed)
Discussed wearing splint at night.  Follow.  Update me over the next several weeks  If persistent problems, will require further evaluation.

## 2019-07-10 ENCOUNTER — Encounter: Payer: Self-pay | Admitting: Internal Medicine

## 2019-07-13 NOTE — Telephone Encounter (Signed)
Patient used bactroban for bumps on her face. Not using close to her eyes. Are you okay with her using it on her face? The bumps look better by looking at the pictures she attached in the previous message.

## 2019-08-06 ENCOUNTER — Encounter: Payer: Self-pay | Admitting: Internal Medicine

## 2019-08-08 NOTE — Telephone Encounter (Signed)
If she is having persistent problems despite wrist splints, I recommend ortho evaluation.  Question of need for injection, etc.  If agreeable, let me know and I will place order for referral.  (also, let me know if has a preference of which ortho she wants to see).

## 2019-08-08 NOTE — Telephone Encounter (Signed)
Pt has been wearing the braces for her carpal tunnel at night time. Was wondering if compression gloves may help her?

## 2019-08-31 IMAGING — MR MR LUMBAR SPINE W/O CM
4 of 5 series · 26 of 48 positions shown · non-contrast
Comparison: None.

CLINICAL DATA: Low back pain radiating into the left leg to the
foot.

EXAM:
MRI LUMBAR SPINE WITHOUT CONTRAST
TECHNIQUE: Multiplanar, multisequence MR imaging of the lumbar spine was
performed. No intravenous contrast was administered.

[Series 3: T2 · sagittal · 4.0mm · 0.81mm/px · 6 of 15 slices shown (1 of 2)]
[im 1/15]
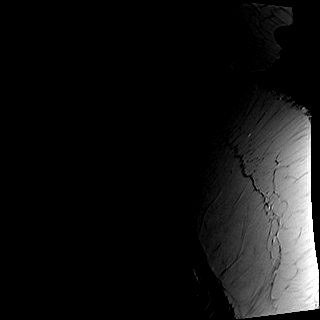
[im 3/15]
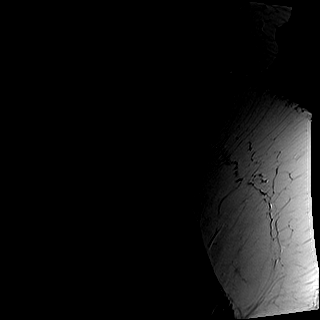
[im 6/15]
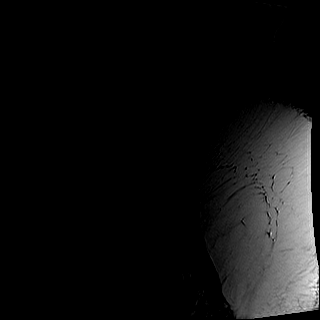
[im 9/15]
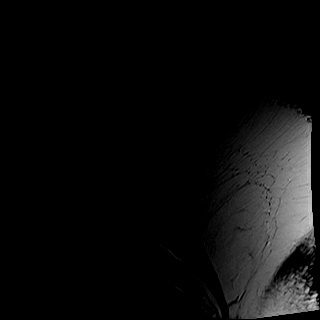
[im 12/15]
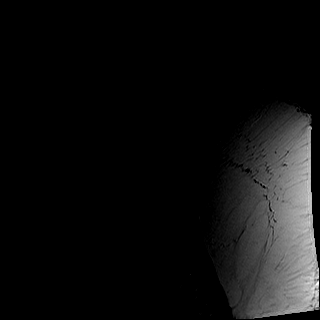
[im 15/15]
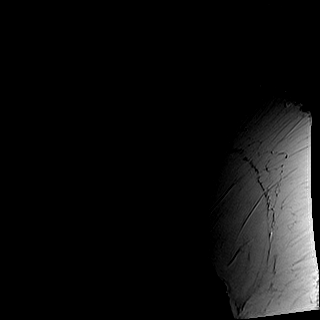

[Series 4: T1 · sagittal · 4.0mm · 0.41mm/px · 6 of 15 slices shown (1 of 2)]
[im 1/15]
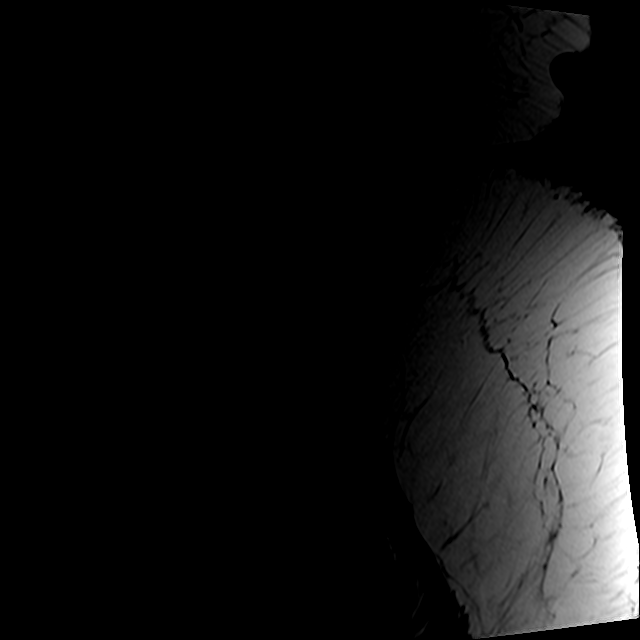
[im 3/15]
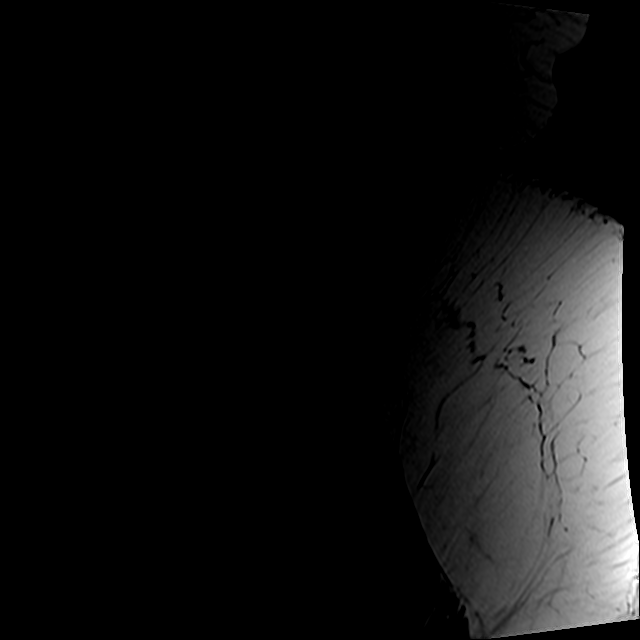
[im 6/15]
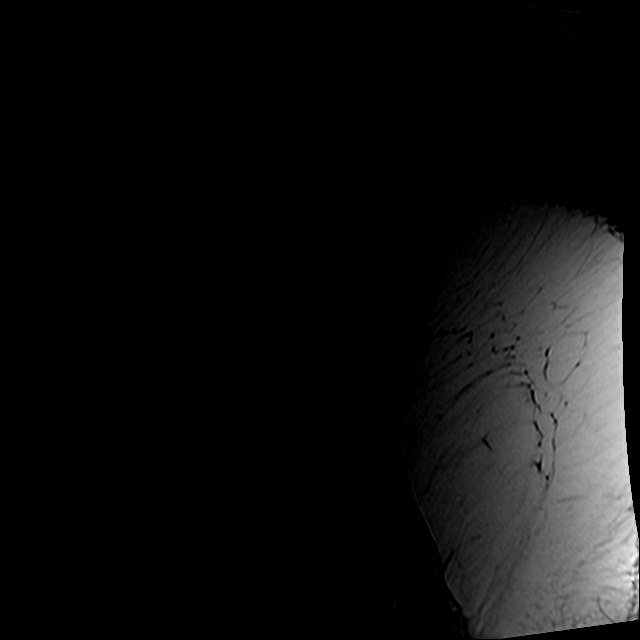
[im 9/15]
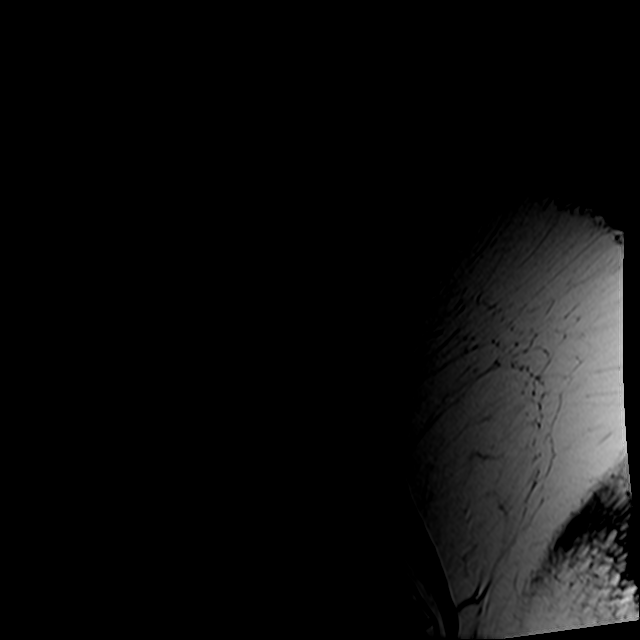
[im 12/15]
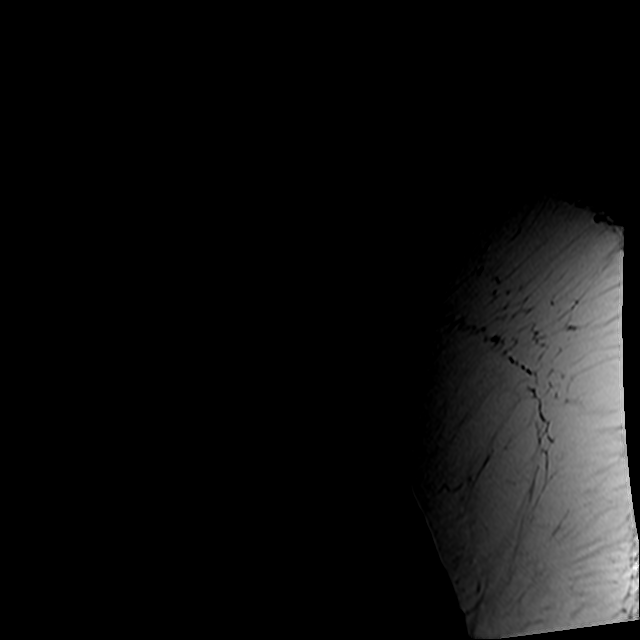
[im 15/15]
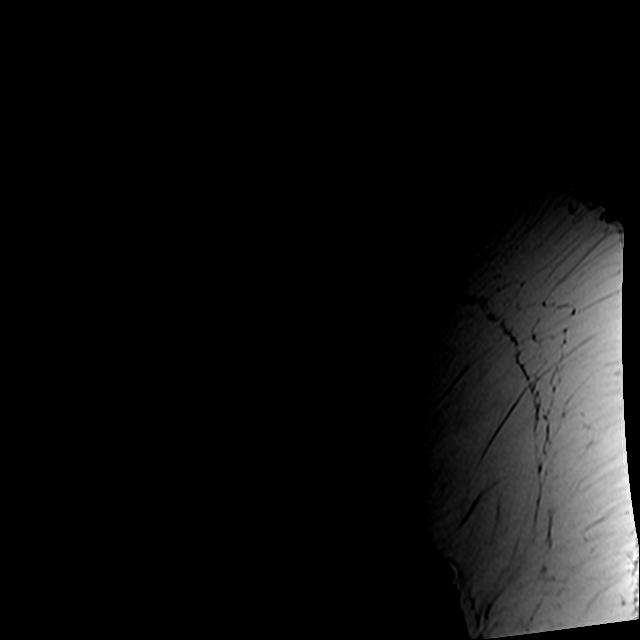

[Series 6: T2 · axial · 4.0mm · 0.78mm/px · z∈[-189,-1]mm · 9 of 35 slices shown (2 of 2)]
[im 1/35]
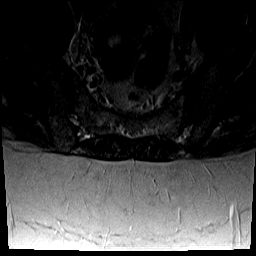
[im 5/35]
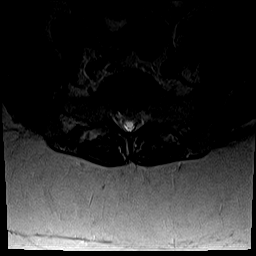
[im 10/35]
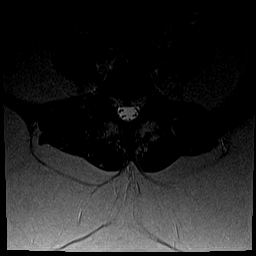
[im 15/35]
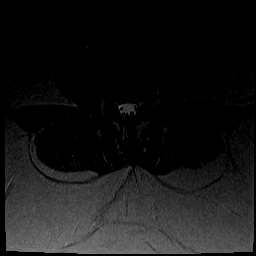
[im 18/35]
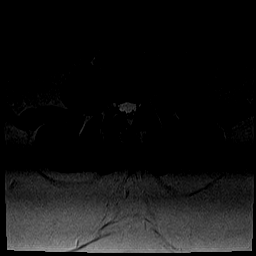
[im 20/35]
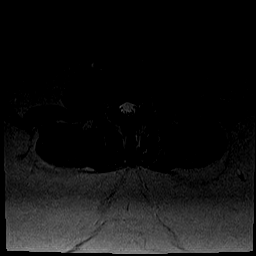
[im 25/35]
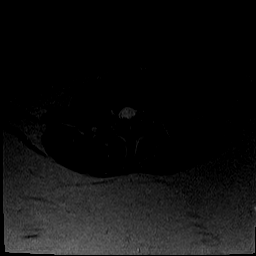
[im 30/35]
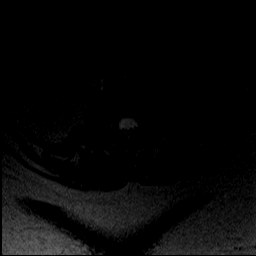
[im 35/35]
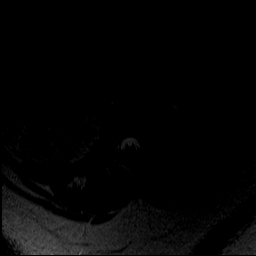

[Series 7: T1 · axial · 4.0mm · 0.39mm/px · z∈[-189,-25]mm · 5 of 35 slices shown (2 of 2)]
[im 1/35]
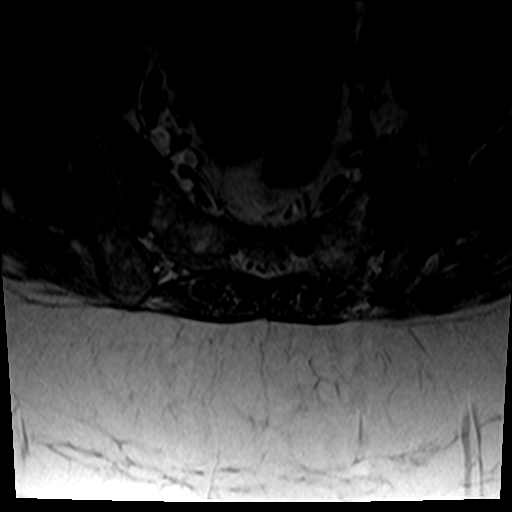
[im 5/35]
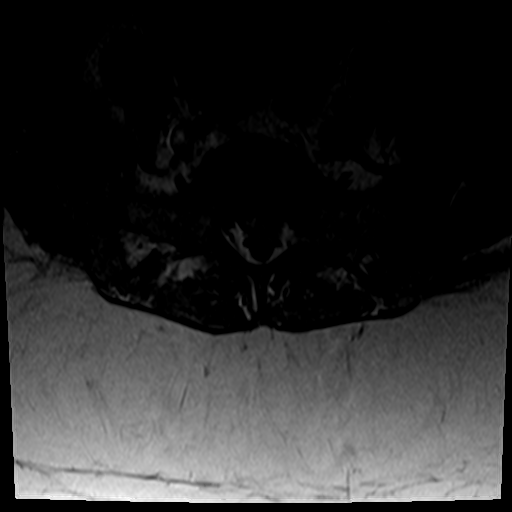
[im 10/35]
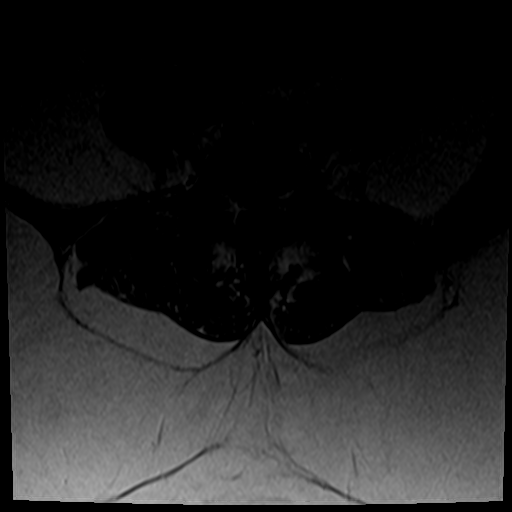
[im 18/35]
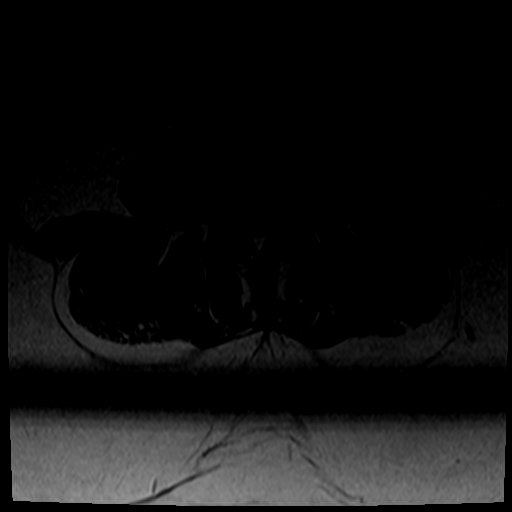
[im 30/35]
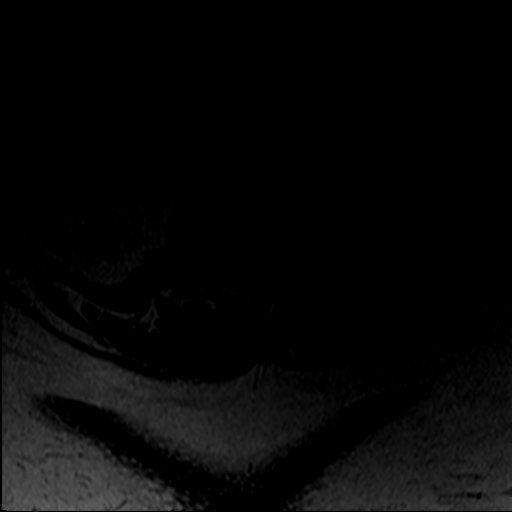

[26 of 48 positions shown; findings below may reference images not displayed]

FINDINGS: Segmentation: Assumed standard. The last well-formed disc space is
designated L5-S1 for the purposes of this report.

Alignment: Accentuated lumbar lordosis. Trace anterolisthesis at
L5-S1.

Vertebrae:  No fracture, evidence of discitis, or bone lesion.

Conus medullaris and cauda equina: Conus extends to the L1 level.
Conus and cauda equina appear normal.

Paraspinal and other soft tissues: Incompletely visualized left
adnexal cyst measuring at least 3.1 cm.

Disc levels:

T11-T12: Disc desiccation with minimal bulging.  No stenosis.

T12-L1:  Negative.

L1-L2:  Negative.

L2-L3:  Negative.

L3-L4:  Disc desiccation with minimal bulging.  No stenosis.

L4-L5:  Negative.

L5-S1: Disc desiccation with shallow broad-based posterior disc
protrusion. Moderate bilateral facet arthropathy with prominent
right facet joint effusion. Moderate bilateral neuroforaminal
stenosis. No spinal canal stenosis.
IMPRESSION: 1. Degenerative changes at L5-S1 with moderate bilateral
neuroforaminal stenosis. Moderate facet arthropathy at this level
with prominent right facet joint effusion that could reflect
instability.
2. Incompletely visualized probably benign left adnexal cyst
measuring at least 3.1 cm. Recommend follow-up pelvic ultrasound in
6-12 weeks. This recommendation follows ACR consensus guidelines:
White Paper of the ACR Incidental Findings Committee II on Adnexal
Findings. [HOSPITAL] [DATE].

## 2019-10-10 ENCOUNTER — Other Ambulatory Visit: Payer: Self-pay | Admitting: Internal Medicine

## 2019-10-10 DIAGNOSIS — M5432 Sciatica, left side: Secondary | ICD-10-CM

## 2019-10-10 MED ORDER — CYCLOBENZAPRINE HCL 5 MG PO TABS: 5.0000 mg | ORAL_TABLET | Freq: Every day | ORAL | 0 refills | Status: DC | PRN

## 2019-10-31 ENCOUNTER — Encounter: Payer: 59 | Admitting: Internal Medicine

## 2020-01-03 ENCOUNTER — Other Ambulatory Visit: Payer: Self-pay | Admitting: Internal Medicine

## 2020-01-03 DIAGNOSIS — M5432 Sciatica, left side: Secondary | ICD-10-CM

## 2020-01-03 MED ORDER — CLOTRIMAZOLE-BETAMETHASONE 1-0.05 % EX CREA: 1.0000 "application " | TOPICAL_CREAM | Freq: Two times a day (BID) | CUTANEOUS | 0 refills | Status: DC

## 2020-01-03 MED ORDER — CYCLOBENZAPRINE HCL 5 MG PO TABS: 5.0000 mg | ORAL_TABLET | Freq: Every day | ORAL | 0 refills | Status: DC | PRN

## 2020-03-21 ENCOUNTER — Encounter: Payer: Self-pay | Admitting: Internal Medicine

## 2020-03-21 NOTE — Telephone Encounter (Signed)
Left message for patient

## 2020-03-24 NOTE — Telephone Encounter (Signed)
Pt returned your call and said she is home and for you to give her a call.

## 2020-03-26 NOTE — Telephone Encounter (Signed)
Left message for patient. Trying to schedule appt with Dr Nicki Reaper

## 2020-03-31 NOTE — Telephone Encounter (Signed)
Patient returned Trisha's phone call. Pt is scheduled for a virtual on 04/14/20 at 4pm.

## 2020-04-14 ENCOUNTER — Telehealth: Payer: 59 | Admitting: Internal Medicine

## 2020-04-25 ENCOUNTER — Encounter: Payer: Self-pay | Admitting: Internal Medicine

## 2020-04-25 ENCOUNTER — Telehealth: Payer: Self-pay | Admitting: *Deleted

## 2020-04-25 ENCOUNTER — Telehealth (INDEPENDENT_AMBULATORY_CARE_PROVIDER_SITE_OTHER): Payer: 59 | Admitting: Internal Medicine

## 2020-04-25 DIAGNOSIS — Z13220 Encounter for screening for lipoid disorders: Secondary | ICD-10-CM

## 2020-04-25 DIAGNOSIS — F419 Anxiety disorder, unspecified: Secondary | ICD-10-CM | POA: Diagnosis not present

## 2020-04-25 DIAGNOSIS — C50912 Malignant neoplasm of unspecified site of left female breast: Secondary | ICD-10-CM

## 2020-04-25 MED ORDER — ESCITALOPRAM OXALATE 20 MG PO TABS: 20.0000 mg | ORAL_TABLET | Freq: Every day | ORAL | 1 refills | Status: DC

## 2020-04-25 NOTE — Progress Notes (Signed)
Patient ID: Brittney Nash, female   DOB: 09/10/1975, 44 y.o.   MRN: [id number]   Virtual Visit via telephone Note  This visit type was conducted due to national recommendations for restrictions regarding the COVID-19 pandemic (e.g. social distancing).  This format is felt to be most appropriate for this patient at this time.  All issues noted in this document were discussed and addressed.  No physical exam was performed (except for noted visual exam findings with Video Visits).   I connected with Brittney Nash today by telephone and verified that I am speaking with the correct person using two identifiers. Location patient: home Location provider: work Persons participating in the virtual visit: patient, provider  The limitations, risks, security and privacy concerns of performing an evaluation and management service by telephone and the availability of in person appointments have been discussed. It has also been discussed with the patient that there may be a patient responsible charge related to this service. The patient expressed understanding and agreed to proceed.   Reason for visit: work in appt  HPI: Work in to discuss increased anxiety.  She gave birth approximately one year ago - baby girl.  Breast fed for approximately 4 months.  Started back on tamoxifen 6 months ago.  Periods regular.  Not sexually active now.  Noticed over the past several months, increased anxiety around her period.  At other times during the month, anxiety controlled.  No significant depression.  No suicidal ideations.  Trying to stay active.  No chest pain or sob reported.  No abdominal pain or bowel change reported.  No cough or congestion.  Now taking lexapro.  Gyn started her on this medication.  On 10mg  q day.  Discussed increasing dose.     ROS: See pertinent positives and negatives per HPI.  Past Medical History:  Diagnosis Date  . Arthritis 2013  . Breast cancer ([place]) 08/26/2016  . History of femur  fracture 2004   s/p surgery with rod placement    Past Surgical History:  Procedure Laterality Date  . BREAST LUMPECTOMY Left 11/02/2016  . LEG SURGERY Left     Family History  Problem Relation Age of Onset  . Arthritis Mother   . Heart disease Paternal Grandfather     SOCIAL HX: reviewed.    Current Outpatient Medications:  .  cyclobenzaprine (FLEXERIL) 5 MG tablet, Take 1 tablet (5 mg total) by mouth daily as needed for muscle spasms., Disp: 30 tablet, Rfl: 0 .  escitalopram (LEXAPRO) 20 MG tablet, Take 1 tablet (20 mg total) by mouth daily., Disp: 30 tablet, Rfl: 1 .  gabapentin (NEURONTIN) 300 MG capsule, Take 300 mg by mouth daily., Disp: , Rfl:  .  tamoxifen (NOLVADEX) 20 MG tablet, Take 20 mg by mouth daily., Disp: , Rfl:   EXAM:  GENERAL: alert.  Answering questions appropriately.  Appears to be in no acute distress.   PSYCH/NEURO: pleasant and cooperative, no obvious depression or anxiety, speech and thought processing grossly intact  ASSESSMENT AND PLAN:  Discussed the following assessment and plan:  Problem List Items Addressed This Visit    Breast cancer ([place])    Followed by oncology.  Back on tamoxifen.        Relevant Orders   CBC with Differential/Platelet   Comprehensive metabolic panel   Anxiety    On lexapro.  Taking 10mg  q day.  Symptoms noticed around her period.  No symptoms other times during the month.  Discussed with her  today.  Will increase lexapro to 20mg  q day.  Follow.  Notify me if symptoms persist and do not improve.  Follow.  Check routine labs.        Relevant Medications   escitalopram (LEXAPRO) 20 MG tablet   Other Relevant Orders   TSH    Other Visit Diagnoses    Screening cholesterol level    -  Primary   Relevant Orders   Lipid panel       I discussed the assessment and treatment plan with the patient. The patient was provided an opportunity to ask questions and all were answered. The patient agreed with the plan and  demonstrated an understanding of the instructions.   The patient was advised to call back or seek an in-person evaluation if the symptoms worsen or if the condition fails to improve as anticipated.  I provided 23 minutes of non-face-to-face time during this encounter.   Einar Pheasant, MD

## 2020-04-25 NOTE — Telephone Encounter (Signed)
Please place future orders for lab appt.  

## 2020-04-26 ENCOUNTER — Encounter: Payer: Self-pay | Admitting: Internal Medicine

## 2020-04-26 DIAGNOSIS — F419 Anxiety disorder, unspecified: Secondary | ICD-10-CM | POA: Insufficient documentation

## 2020-04-26 NOTE — Telephone Encounter (Signed)
Lab orders placed.  

## 2020-04-26 NOTE — Assessment & Plan Note (Addendum)
On lexapro.  Taking 10mg  q day.  Symptoms noticed around her period.  No symptoms other times during the month.  Discussed with her today.  Will increase lexapro to 20mg  q day.  Follow.  Notify me if symptoms persist and do not improve.  Follow.  Check routine labs.

## 2020-04-26 NOTE — Assessment & Plan Note (Signed)
Followed by oncology.  Back on tamoxifen.

## 2020-04-28 ENCOUNTER — Encounter: Payer: Self-pay | Admitting: Internal Medicine

## 2020-04-28 ENCOUNTER — Other Ambulatory Visit: Payer: 59

## 2020-04-28 DIAGNOSIS — L709 Acne, unspecified: Secondary | ICD-10-CM

## 2020-05-12 ENCOUNTER — Other Ambulatory Visit: Payer: Self-pay | Admitting: Internal Medicine

## 2020-05-12 DIAGNOSIS — M5432 Sciatica, left side: Secondary | ICD-10-CM

## 2020-05-27 NOTE — Telephone Encounter (Signed)
Order placed for dermatology referral.  

## 2020-06-13 ENCOUNTER — Other Ambulatory Visit: Payer: Self-pay | Admitting: Internal Medicine

## 2020-06-13 DIAGNOSIS — M5432 Sciatica, left side: Secondary | ICD-10-CM

## 2020-06-13 MED ORDER — CYCLOBENZAPRINE HCL 5 MG PO TABS: 5.0000 mg | ORAL_TABLET | Freq: Three times a day (TID) | ORAL | 0 refills | Status: DC | PRN

## 2020-06-23 ENCOUNTER — Encounter: Payer: Self-pay | Admitting: Internal Medicine

## 2020-07-11 ENCOUNTER — Encounter: Payer: Self-pay | Admitting: Internal Medicine

## 2020-07-14 ENCOUNTER — Ambulatory Visit: Payer: 59 | Admitting: Internal Medicine

## 2020-10-29 ENCOUNTER — Other Ambulatory Visit: Payer: Self-pay

## 2020-10-29 ENCOUNTER — Ambulatory Visit (INDEPENDENT_AMBULATORY_CARE_PROVIDER_SITE_OTHER): Payer: PRIVATE HEALTH INSURANCE | Admitting: Dermatology

## 2020-10-29 DIAGNOSIS — L905 Scar conditions and fibrosis of skin: Secondary | ICD-10-CM | POA: Diagnosis not present

## 2020-10-29 DIAGNOSIS — L700 Acne vulgaris: Secondary | ICD-10-CM | POA: Diagnosis not present

## 2020-10-29 DIAGNOSIS — L819 Disorder of pigmentation, unspecified: Secondary | ICD-10-CM | POA: Diagnosis not present

## 2020-10-29 MED ORDER — ADAPALENE 0.1 % EX CREA: TOPICAL_CREAM | Freq: Every day | CUTANEOUS | 2 refills | Status: DC

## 2020-10-29 MED ORDER — DOXYCYCLINE HYCLATE 50 MG PO CAPS: 50.0000 mg | ORAL_CAPSULE | Freq: Every day | ORAL | 2 refills | Status: DC

## 2020-10-29 NOTE — Progress Notes (Signed)
   New Patient Visit  Subjective  Brittney Nash is a 45 y.o. female who presents for the following: Skin Problem (Check  growths on her face, sometimes itchy, tried otc Acne cream no help ).  The following portions of the chart were reviewed this encounter and updated as appropriate:   Tobacco  Allergies  Meds  Problems  Med Hx  Surg Hx  Fam Hx     Review of Systems:  No other skin or systemic complaints except as noted in HPI or Assessment and Plan.  Objective  Well appearing patient in no apparent distress; mood and affect are within normal limits.  A focused examination was performed including face. Relevant physical exam findings are noted in the Assessment and Plan.  Objective  Head - Anterior (Face): 2 papules on the right cheek, 1 on the right mandible, fine ice pick scarring, spotty hyperpigmentation   Images       Assessment & Plan  Acne vulgaris-chronic and persistent with active papules and nodules and scarring and dyschromia Head - Anterior (Face) Start Doxycyline 50 mg take 1 tablet at dinner time    Doxycycline should be taken with food to prevent nausea. Do not lay down for 30 minutes after taking. Be cautious with sun exposure and use good sun protection while on this medication. Pregnant women should not take this medication.    Start Adapalene 0.3% gel apply to face at bedtime   Topical retinoid medications like tretinoin/Retin-A, adapalene/Differin, tazarotene/Fabior, and Epiduo/Epiduo Forte can cause dryness and irritation when first started. Only apply a pea-sized amount to the entire affected area. Avoid applying it around the eyes, edges of mouth and creases at the nose. If you experience irritation, use a good moisturizer first and/or apply the medicine less often. If you are doing well with the medicine, you can increase how often you use it until you are applying every night. Be careful with sun protection while using this medication as it can  make you sensitive to the sun. This medicine should not be used by pregnant women.    Ordered Medications: doxycycline (VIBRAMYCIN) 50 MG capsule adapalene (DIFFERIN) 0.1 % cream  Return in about 3 months (around 01/29/2021) for acne .  IMarye Round, CMA, am acting as scribe for Sarina Ser, MD .  Documentation: I have reviewed the above documentation for accuracy and completeness, and I agree with the above.  Sarina Ser, MD

## 2020-10-29 NOTE — Patient Instructions (Addendum)
  Doxycycline should be taken with food to prevent nausea. Do not lay down for 30 minutes after taking. Be cautious with sun exposure and use good sun protection while on this medication. Pregnant women should not take this medication.    Topical retinoid medications like tretinoin/Retin-A, adapalene/Differin, tazarotene/Fabior, and Epiduo/Epiduo Forte can cause dryness and irritation when first started. Only apply a pea-sized amount to the entire affected area. Avoid applying it around the eyes, edges of mouth and creases at the nose. If you experience irritation, use a good moisturizer first and/or apply the medicine less often. If you are doing well with the medicine, you can increase how often you use it until you are applying every night. Be careful with sun protection while using this medication as it can make you sensitive to the sun. This medicine should not be used by pregnant women.

## 2020-10-31 ENCOUNTER — Encounter: Payer: Self-pay | Admitting: Dermatology

## 2020-12-25 ENCOUNTER — Encounter: Payer: Self-pay | Admitting: Internal Medicine

## 2020-12-25 DIAGNOSIS — M5432 Sciatica, left side: Secondary | ICD-10-CM

## 2021-01-06 ENCOUNTER — Other Ambulatory Visit: Payer: Self-pay | Admitting: Internal Medicine

## 2021-01-07 ENCOUNTER — Encounter: Payer: Self-pay | Admitting: Internal Medicine

## 2021-01-07 DIAGNOSIS — R300 Dysuria: Secondary | ICD-10-CM

## 2021-01-07 NOTE — Telephone Encounter (Signed)
Left message for patient to return call back. Need to know symptoms and how long they have been going on for.

## 2021-01-07 NOTE — Telephone Encounter (Signed)
Patient stated she will drop off urine tomorrow at 1530. Appointment has been scheduled. No pregnancy concerns either.

## 2021-01-07 NOTE — Telephone Encounter (Signed)
See if she can drop off urine tomorrow and then will work in for appt (virtual appt).  Orders placed for urinalysis and culture.  See if any concern regarding pregnancy.  If so, add urine pregnancy test to orders.

## 2021-01-07 NOTE — Telephone Encounter (Signed)
Spoken to patient she is having urinary frequency and pain after voiding. No burning or blood in urine. Sx have been going on for 3 days. Has not taken any otc medication for sx. Please advise.

## 2021-01-07 NOTE — Telephone Encounter (Signed)
Please triage

## 2021-01-08 ENCOUNTER — Other Ambulatory Visit: Payer: PRIVATE HEALTH INSURANCE

## 2021-01-08 ENCOUNTER — Encounter: Payer: Self-pay | Admitting: Internal Medicine

## 2021-01-08 ENCOUNTER — Other Ambulatory Visit: Payer: Self-pay

## 2021-01-08 ENCOUNTER — Other Ambulatory Visit: Payer: Self-pay | Admitting: *Deleted

## 2021-01-08 ENCOUNTER — Telehealth (INDEPENDENT_AMBULATORY_CARE_PROVIDER_SITE_OTHER): Payer: PRIVATE HEALTH INSURANCE | Admitting: Internal Medicine

## 2021-01-08 DIAGNOSIS — R300 Dysuria: Secondary | ICD-10-CM

## 2021-01-08 DIAGNOSIS — F439 Reaction to severe stress, unspecified: Secondary | ICD-10-CM

## 2021-01-08 LAB — POCT URINALYSIS DIP (MANUAL ENTRY)
Bilirubin, UA: NEGATIVE
Glucose, UA: NEGATIVE mg/dL
Ketones, POC UA: NEGATIVE mg/dL
Nitrite, UA: POSITIVE — AB
Protein Ur, POC: 100 mg/dL — AB
Spec Grav, UA: >=1.03 — AB (ref 1.010–1.025)
Urobilinogen, UA: 0.2 E.U./dL
pH, UA: 5.5 (ref 5.0–8.0)

## 2021-01-08 LAB — POCT URINE PREGNANCY: Preg Test, Ur: NEGATIVE

## 2021-01-08 MED ORDER — NITROFURANTOIN MONOHYD MACRO 100 MG PO CAPS: 100.0000 mg | ORAL_CAPSULE | Freq: Two times a day (BID) | ORAL | 0 refills | Status: DC

## 2021-01-08 NOTE — Telephone Encounter (Signed)
Please add her to schedule today - virtual visit after last pt scheduled.  Also, add point of care urine pregnancy test to orders.  Confirm no nausea, vomiting, etc.

## 2021-01-08 NOTE — Addendum Note (Signed)
Addended by: Elpidio Galea T on: 01/08/2021 12:55 PM   Modules accepted: Orders

## 2021-01-08 NOTE — Progress Notes (Signed)
Patient ID: Brittney Nash, female   DOB: 1976/02/18, 45 y.o.   MRN: [id number]   Virtual Visit via video Note  This visit type was conducted due to national recommendations for restrictions regarding the COVID-19 pandemic (e.g. social distancing).  This format is felt to be most appropriate for this patient at this time.  All issues noted in this document were discussed and addressed.  No physical exam was performed (except for noted visual exam findings with Video Visits).   I connected with Brittney Nash by a video enabled telemedicine application and verified that I am speaking with the correct person using two identifiers. Location patient: home Location provider: work  Persons participating in the virtual visit: patient, provider  The limitations, risks, security and privacy concerns of performing an evaluation and management service by video and the availability of in person appointments have been discussed.  It has also been discussed with the patient that there may be a patient responsible charge related to this service. The patient expressed understanding and agreed to proceed.    Reason for visit: work in appt  HPI: Work in with concerns regarding urinary tract infection.  States symptoms started 01/05/21 - aching.  Urinated - aching.  No fever.  No nausea or vomiting.  No back pain or hematuria.  No vaginal symptoms.  Does report urinary urgency.  Increased frequency.  Eating and drinking.  Confirm no concerns regarding being pregnant.     ROS: See pertinent positives and negatives per HPI.  Past Medical History:  Diagnosis Date   Arthritis 2013   Breast cancer ([place]) 08/26/2016   History of femur fracture 2004   s/p surgery with rod placement    Past Surgical History:  Procedure Laterality Date   BREAST LUMPECTOMY Left 11/02/2016   LEG SURGERY Left     Family History  Problem Relation Age of Onset   Arthritis Mother    Heart disease Paternal Grandfather     SOCIAL  HX: reviewed.    Current Outpatient Medications:    cyclobenzaprine (FLEXERIL) 5 MG tablet, Take 1 tablet (5 mg total) by mouth 3 (three) times daily as needed for muscle spasms., Disp: 30 tablet, Rfl: 0   escitalopram (LEXAPRO) 20 MG tablet, Take 1 tablet (20 mg total) by mouth daily., Disp: 30 tablet, Rfl: 1   gabapentin (NEURONTIN) 300 MG capsule, Take 300 mg by mouth daily., Disp: , Rfl:    nitrofurantoin, macrocrystal-monohydrate, (MACROBID) 100 MG capsule, Take 1 capsule (100 mg total) by mouth 2 (two) times daily., Disp: 10 capsule, Rfl: 0   clotrimazole-betamethasone (LOTRISONE) cream, Apply 1 application topically daily as needed. Do not use in the same spot for more than 7-10 days in a row., Disp: 30 g, Rfl: 0  EXAM:  GENERAL: alert, oriented, appears well and in no acute distress  HEENT: atraumatic, conjunttiva clear, no obvious abnormalities on inspection of external nose and ears  NECK: normal movements of the head and neck  LUNGS: on inspection no signs of respiratory distress, breathing rate appears normal, no obvious gross SOB, gasping or wheezing  CV: no obvious cyanosis  PSYCH/NEURO: pleasant and cooperative, no obvious depression or anxiety, speech and thought processing grossly intact  ASSESSMENT AND PLAN:  Discussed the following assessment and plan:  Problem List Items Addressed This Visit     Dysuria    Symptoms as outlined.  Urine dip - positive nitrite, trace leukocytes and large blood.  Given symptoms and urine findings, concern over possible  UTI.  Send for culture.  Treat with macrobid.  Fluids.  Follow.  Await culture results.         Stress    Appears to be doing well on citalopram.          Return if symptoms worsen or fail to improve.   I discussed the assessment and treatment plan with the patient. The patient was provided an opportunity to ask questions and all were answered. The patient agreed with the plan and demonstrated an  understanding of the instructions.   The patient was advised to call back or seek an in-person evaluation if the symptoms worsen or if the condition fails to improve as anticipated.   Einar Pheasant, MD

## 2021-01-09 LAB — URINALYSIS, ROUTINE W REFLEX MICROSCOPIC
Bilirubin Urine: NEGATIVE
Ketones, ur: NEGATIVE
Nitrite: POSITIVE — AB
Specific Gravity, Urine: 1.025 (ref 1.000–1.030)
Total Protein, Urine: 30 — AB
Urine Glucose: NEGATIVE
Urobilinogen, UA: 0.2 (ref 0.0–1.0)
pH: 6 (ref 5.0–8.0)

## 2021-01-10 LAB — URINE CULTURE
MICRO NUMBER:: 12119595
SPECIMEN QUALITY:: ADEQUATE

## 2021-01-12 ENCOUNTER — Other Ambulatory Visit: Payer: Self-pay | Admitting: Internal Medicine

## 2021-01-12 ENCOUNTER — Telehealth: Payer: Self-pay

## 2021-01-12 DIAGNOSIS — R319 Hematuria, unspecified: Secondary | ICD-10-CM

## 2021-01-12 NOTE — Progress Notes (Signed)
Order placed for f/u urinalysis 

## 2021-01-12 NOTE — Telephone Encounter (Signed)
Called to speak with [facility]. Left a message to call back and schedule a lab visit in 2 weeks for urinalysis.

## 2021-01-13 NOTE — Telephone Encounter (Signed)
Need a little more information.  Where is the rash?  How often does she use the cream?

## 2021-01-14 MED ORDER — CLOTRIMAZOLE-BETAMETHASONE 1-0.05 % EX CREA
1.0000 | TOPICAL_CREAM | Freq: Every day | CUTANEOUS | 0 refills | Status: DC | PRN
Start: 2021-01-14 — End: 2022-01-18

## 2021-01-14 NOTE — Telephone Encounter (Signed)
Rx sent in for lotrisone 

## 2021-01-19 ENCOUNTER — Encounter: Payer: Self-pay | Admitting: Internal Medicine

## 2021-01-19 DIAGNOSIS — R300 Dysuria: Secondary | ICD-10-CM | POA: Insufficient documentation

## 2021-01-19 NOTE — Assessment & Plan Note (Signed)
Appears to be doing well on citalopram.

## 2021-01-19 NOTE — Assessment & Plan Note (Signed)
Symptoms as outlined.  Urine dip - positive nitrite, trace leukocytes and large blood.  Given symptoms and urine findings, concern over possible UTI.  Send for culture.  Treat with macrobid.  Fluids.  Follow.  Await culture results.

## 2021-01-27 ENCOUNTER — Other Ambulatory Visit: Payer: PRIVATE HEALTH INSURANCE

## 2021-02-09 ENCOUNTER — Other Ambulatory Visit: Payer: Self-pay | Admitting: Internal Medicine

## 2021-02-09 DIAGNOSIS — M5432 Sciatica, left side: Secondary | ICD-10-CM

## 2021-02-09 NOTE — Telephone Encounter (Signed)
Refilled: 06/13/2020 Last OV: 04/25/2020 Next OV: not scheduled

## 2021-02-10 ENCOUNTER — Ambulatory Visit: Payer: PRIVATE HEALTH INSURANCE | Admitting: Dermatology

## 2021-02-10 NOTE — Telephone Encounter (Signed)
Received notification for flexeril. Please confirm with pt is still needing.  If so, what is she taking it for and how often taking?

## 2021-02-10 NOTE — Telephone Encounter (Signed)
Please clarify why needs the flexeril.  Is she having problems?  Any concern about being pregnant?

## 2021-02-12 MED ORDER — CYCLOBENZAPRINE HCL 5 MG PO TABS: 5.0000 mg | ORAL_TABLET | Freq: Every evening | ORAL | 0 refills | Status: DC | PRN

## 2021-02-12 NOTE — Telephone Encounter (Signed)
See mychart.  

## 2021-02-12 NOTE — Telephone Encounter (Signed)
Rx sent in for flexeril.

## 2021-03-31 ENCOUNTER — Other Ambulatory Visit: Payer: Self-pay | Admitting: Internal Medicine

## 2021-04-15 ENCOUNTER — Encounter: Payer: Self-pay | Admitting: Internal Medicine

## 2021-04-16 NOTE — Telephone Encounter (Signed)
If persistent rash/itching - needs to be seen.  I can work her in next week - Wednesday 12:30.  If needs something prior, I can call in nystatin cream - apply to affected area bid one tube no refills.

## 2021-04-17 NOTE — Telephone Encounter (Signed)
LMTCB

## 2021-04-20 NOTE — Telephone Encounter (Signed)
LMTCB

## 2021-04-21 NOTE — Telephone Encounter (Signed)
Patient is returning your call from earlier.Please call her at [phone].

## 2021-04-21 NOTE — Telephone Encounter (Signed)
Pt scheduled with Dr Scott 

## 2021-04-24 ENCOUNTER — Other Ambulatory Visit: Payer: Self-pay

## 2021-04-24 ENCOUNTER — Ambulatory Visit (INDEPENDENT_AMBULATORY_CARE_PROVIDER_SITE_OTHER): Payer: PRIVATE HEALTH INSURANCE | Admitting: Internal Medicine

## 2021-04-24 DIAGNOSIS — F419 Anxiety disorder, unspecified: Secondary | ICD-10-CM

## 2021-04-24 DIAGNOSIS — R319 Hematuria, unspecified: Secondary | ICD-10-CM

## 2021-04-24 DIAGNOSIS — R21 Rash and other nonspecific skin eruption: Secondary | ICD-10-CM

## 2021-04-24 DIAGNOSIS — Z13220 Encounter for screening for lipoid disorders: Secondary | ICD-10-CM | POA: Diagnosis not present

## 2021-04-24 DIAGNOSIS — C50912 Malignant neoplasm of unspecified site of left female breast: Secondary | ICD-10-CM | POA: Diagnosis not present

## 2021-04-24 LAB — URINALYSIS, ROUTINE W REFLEX MICROSCOPIC
Bilirubin Urine: NEGATIVE
Hgb urine dipstick: NEGATIVE
Ketones, ur: NEGATIVE
Leukocytes,Ua: NEGATIVE
Nitrite: NEGATIVE
Specific Gravity, Urine: 1.02 (ref 1.000–1.030)
Total Protein, Urine: NEGATIVE
Urine Glucose: NEGATIVE
Urobilinogen, UA: 0.2 (ref 0.0–1.0)
pH: 6 (ref 5.0–8.0)

## 2021-04-24 LAB — CBC WITH DIFFERENTIAL/PLATELET
Basophils Absolute: 0 10*3/uL (ref 0.0–0.1)
Basophils Relative: 0.6 % (ref 0.0–3.0)
Eosinophils Absolute: 0.2 10*3/uL (ref 0.0–0.7)
Eosinophils Relative: 3.3 % (ref 0.0–5.0)
HCT: 34.9 % — ABNORMAL LOW (ref 36.0–46.0)
Hemoglobin: 11.2 g/dL — ABNORMAL LOW (ref 12.0–15.0)
Lymphocytes Relative: 34.2 % (ref 12.0–46.0)
Lymphs Abs: 1.6 10*3/uL (ref 0.7–4.0)
MCHC: 32.1 g/dL (ref 30.0–36.0)
MCV: 81.8 fl (ref 78.0–100.0)
Monocytes Absolute: 0.4 10*3/uL (ref 0.1–1.0)
Monocytes Relative: 8.6 % (ref 3.0–12.0)
Neutro Abs: 2.4 10*3/uL (ref 1.4–7.7)
Neutrophils Relative %: 53.3 % (ref 43.0–77.0)
Platelets: 278 10*3/uL (ref 150.0–400.0)
RBC: 4.26 Mil/uL (ref 3.87–5.11)
RDW: 14.9 % (ref 11.5–15.5)
WBC: 4.5 10*3/uL (ref 4.0–10.5)

## 2021-04-24 LAB — COMPREHENSIVE METABOLIC PANEL
ALT: 14 U/L (ref 0–35)
AST: 16 U/L (ref 0–37)
Albumin: 4.1 g/dL (ref 3.5–5.2)
Alkaline Phosphatase: 96 U/L (ref 39–117)
BUN: 12 mg/dL (ref 6–23)
CO2: 27 mEq/L (ref 19–32)
Calcium: 9.3 mg/dL (ref 8.4–10.5)
Chloride: 104 mEq/L (ref 96–112)
Creatinine, Ser: 0.61 mg/dL (ref 0.40–1.20)
GFR: 107.99 mL/min (ref >60.00)
Glucose, Bld: 83 mg/dL (ref 70–99)
Potassium: 4 mEq/L (ref 3.5–5.1)
Sodium: 138 mEq/L (ref 135–145)
Total Bilirubin: 0.8 mg/dL (ref 0.2–1.2)
Total Protein: 8.1 g/dL (ref 6.0–8.3)

## 2021-04-24 LAB — LIPID PANEL
Cholesterol: 157 mg/dL (ref 0–200)
HDL: 60.6 mg/dL (ref >39.00)
LDL Cholesterol: 85 mg/dL (ref 0–99)
NonHDL: 96.14
Total CHOL/HDL Ratio: 3
Triglycerides: 57 mg/dL (ref 0.0–149.0)
VLDL: 11.4 mg/dL (ref 0.0–40.0)

## 2021-04-24 LAB — TSH: TSH: 1.8 u[IU]/mL (ref 0.35–5.50)

## 2021-04-24 MED ORDER — NYSTATIN 100000 UNIT/GM EX CREA: 1.0000 "application " | TOPICAL_CREAM | Freq: Two times a day (BID) | CUTANEOUS | 0 refills | Status: DC

## 2021-04-24 NOTE — Progress Notes (Signed)
Patient ID: Brittney Nash, female   DOB: November 28, 1975, 45 y.o.   MRN: [id number]   Subjective:    Patient ID: Brittney Nash, female    DOB: 1975-11-03, 45 y.o.   MRN: [id number]  This visit occurred during the SARS-CoV-2 public health emergency.  Safety protocols were in place, including screening questions prior to the visit, additional usage of staff PPE, and extensive cleaning of exam room while observing appropriate contact time as indicated for disinfecting solutions.   Patient here for work in appt.  Chief Complaint  Patient presents with   Rash   .   HPI Work in for rash - under left breast.  Present for 3 weeks.  Location is under left breast - where she had radiation.  Noticed rash, itching.  Used cream - she uses for eczema.  Has improved some, but is still present.  No fever.  No other rash.  Handling stress.  No concern over pregnancy. Not breast feeding.    Past Medical History:  Diagnosis Date   Arthritis 2013   Breast cancer ([place]) 08/26/2016   History of femur fracture 2004   s/p surgery with rod placement   Past Surgical History:  Procedure Laterality Date   BREAST LUMPECTOMY Left 11/02/2016   LEG SURGERY Left    Family History  Problem Relation Age of Onset   Arthritis Mother    Heart disease Paternal Grandfather    Social History   Socioeconomic History   Marital status: Single    Spouse name: Not on file   Number of children: Not on file   Years of education: Not on file   Highest education level: Not on file  Occupational History   Not on file  Tobacco Use   Smoking status: Never   Smokeless tobacco: Never  Substance and Sexual Activity   Alcohol use: Yes    Comment: occ 1-2 times a month    Drug use: No   Sexual activity: Not on file  Other Topics Concern   Not on file  Social History Narrative   Not on file   Social Determinants of Health   Financial Resource Strain: Not on file  Food Insecurity: Not on file  Transportation Needs: Not on  file  Physical Activity: Not on file  Stress: Not on file  Social Connections: Not on file     Review of Systems  Constitutional:  Negative for appetite change and fever.  Respiratory:  Negative for cough, chest tightness and shortness of breath.   Cardiovascular:  Negative for chest pain and leg swelling.  Gastrointestinal:  Negative for abdominal pain, diarrhea, nausea and vomiting.  Skin:  Positive for rash.  Neurological:  Negative for dizziness, light-headedness and headaches.  Psychiatric/Behavioral:  Negative for agitation and dysphoric mood.       Objective:     BP 130/78   Pulse 89   Temp 97.6 F (36.4 C)   Resp 16   Ht 4\' 11"  (1.499 m)   Wt (!) 312 lb (141.5 kg)   SpO2 98%   BMI 63.02 kg/m  Wt Readings from Last 3 Encounters:  04/24/21 (!) 312 lb (141.5 kg)  01/08/21 (!) 306 lb (138.8 kg)  08/17/18 292 lb (132.5 kg)    Physical Exam Vitals reviewed.  Constitutional:      General: She is not in acute distress.    Appearance: Normal appearance.  HENT:     Head: Normocephalic and atraumatic.  Neck:  Thyroid: No thyromegaly.  Cardiovascular:     Rate and Rhythm: Normal rate and regular rhythm.  Pulmonary:     Effort: No respiratory distress.     Breath sounds: Normal breath sounds. No wheezing.  Abdominal:     General: Bowel sounds are normal.     Palpations: Abdomen is soft.     Tenderness: There is no abdominal tenderness.  Musculoskeletal:     Cervical back: Neck supple. No tenderness.  Lymphadenopathy:     Cervical: No cervical adenopathy.  Skin:    Comments: Minimal color change - under left breast- minimal erythema.    Neurological:     Mental Status: She is alert.  Psychiatric:        Mood and Affect: Mood normal.        Behavior: Behavior normal.     Outpatient Encounter Medications as of 04/24/2021  Medication Sig   nystatin cream (MYCOSTATIN) Apply 1 application topically 2 (two) times daily.   clotrimazole-betamethasone  (LOTRISONE) cream Apply 1 application topically daily as needed. Do not use in the same spot for more than 7-10 days in a row.   cyclobenzaprine (FLEXERIL) 5 MG tablet Take 1 tablet (5 mg total) by mouth at bedtime as needed for muscle spasms.   escitalopram (LEXAPRO) 20 MG tablet TAKE 1 TABLET(20 MG) BY MOUTH DAILY   [DISCONTINUED] gabapentin (NEURONTIN) 300 MG capsule Take 300 mg by mouth daily.   [DISCONTINUED] nitrofurantoin, macrocrystal-monohydrate, (MACROBID) 100 MG capsule Take 1 capsule (100 mg total) by mouth 2 (two) times daily.   No facility-administered encounter medications on file as of 04/24/2021.     Lab Results  Component Value Date   WBC 4.5 04/24/2021   HGB 11.2 (L) 04/24/2021   HCT 34.9 (L) 04/24/2021   PLT 278.0 04/24/2021   GLUCOSE 83 04/24/2021   CHOL 157 04/24/2021   TRIG 57.0 04/24/2021   HDL 60.60 04/24/2021   LDLCALC 85 04/24/2021   ALT 14 04/24/2021   AST 16 04/24/2021   NA 138 04/24/2021   K 4.0 04/24/2021   CL 104 04/24/2021   CREATININE 0.61 04/24/2021   BUN 12 04/24/2021   CO2 27 04/24/2021   TSH 1.80 04/24/2021   INR 1.1 09/02/2011    MR LUMBAR SPINE WO CONTRAST  Result Date: 08/01/2018 CLINICAL DATA:  Low back pain radiating into the left leg to the foot. EXAM: MRI LUMBAR SPINE WITHOUT CONTRAST TECHNIQUE: Multiplanar, multisequence MR imaging of the lumbar spine was performed. No intravenous contrast was administered. COMPARISON:  None. FINDINGS: Segmentation: Assumed standard. The last well-formed disc space is designated L5-S1 for the purposes of this report. Alignment: Accentuated lumbar lordosis. Trace anterolisthesis at L5-S1. Vertebrae:  No fracture, evidence of discitis, or bone lesion. Conus medullaris and cauda equina: Conus extends to the L1 level. Conus and cauda equina appear normal. Paraspinal and other soft tissues: Incompletely visualized left adnexal cyst measuring at least 3.1 cm. Disc levels: T11-T12: Disc desiccation with minimal  bulging.  No stenosis. T12-L1:  Negative. L1-L2:  Negative. L2-L3:  Negative. L3-L4:  Disc desiccation with minimal bulging.  No stenosis. L4-L5:  Negative. L5-S1: Disc desiccation with shallow broad-based posterior disc protrusion. Moderate bilateral facet arthropathy with prominent right facet joint effusion. Moderate bilateral neuroforaminal stenosis. No spinal canal stenosis. IMPRESSION: 1. Degenerative changes at L5-S1 with moderate bilateral neuroforaminal stenosis. Moderate facet arthropathy at this level with prominent right facet joint effusion that could reflect instability. 2. Incompletely visualized probably benign left adnexal cyst measuring  at least 3.1 cm. Recommend follow-up pelvic ultrasound in 6-12 weeks. This recommendation follows ACR consensus guidelines: White Paper of the ACR Incidental Findings Committee II on Adnexal Findings. J Am Coll Radiol [phone]. Electronically Signed   By: Titus Dubin M.D.   On: 08/01/2018 16:13       Assessment & Plan:   Problem List Items Addressed This Visit     Anxiety   Breast cancer ([place])    Followed by oncology.  Tamoxifen.       Hematuria    Check urine today to confirm clear.       Rash    Rash beneath her left breast.  Appears to be c/w yeast infection.  Nystatin cream as directed.  Keep dry.  Follow.  Notify me if persistent.       Other Visit Diagnoses     Screening cholesterol level            Einar Pheasant, MD

## 2021-04-24 NOTE — Assessment & Plan Note (Signed)
Check urine today to confirm clear.

## 2021-04-25 ENCOUNTER — Other Ambulatory Visit: Payer: Self-pay | Admitting: Internal Medicine

## 2021-04-25 ENCOUNTER — Encounter: Payer: Self-pay | Admitting: Internal Medicine

## 2021-04-25 DIAGNOSIS — D649 Anemia, unspecified: Secondary | ICD-10-CM

## 2021-04-25 NOTE — Assessment & Plan Note (Signed)
Followed by oncology.  Tamoxifen.

## 2021-04-25 NOTE — Assessment & Plan Note (Signed)
Rash beneath her left breast.  Appears to be c/w yeast infection.  Nystatin cream as directed.  Keep dry.  Follow.  Notify me if persistent.

## 2021-04-25 NOTE — Progress Notes (Signed)
Order placed for f/u labs.  

## 2021-04-28 ENCOUNTER — Telehealth: Payer: Self-pay

## 2021-04-28 NOTE — Telephone Encounter (Signed)
-----   Message from Einar Pheasant, MD sent at 04/25/2021  9:20 PM EDT ----- Loma Messing that her cholesterol levels look good.  Urine - ok (no blood).  Hgb is slightly decreased.  Need to recheck cbc in 3-4 weeks to confirm stable/normal.  Will check iron studies at that time.  (Labs ordered). Thyroid test, kidney function tests and liver function tests are wnl.

## 2021-05-26 ENCOUNTER — Other Ambulatory Visit: Payer: PRIVATE HEALTH INSURANCE

## 2021-05-28 ENCOUNTER — Other Ambulatory Visit (INDEPENDENT_AMBULATORY_CARE_PROVIDER_SITE_OTHER): Payer: PRIVATE HEALTH INSURANCE

## 2021-05-28 ENCOUNTER — Other Ambulatory Visit: Payer: Self-pay

## 2021-05-28 DIAGNOSIS — D649 Anemia, unspecified: Secondary | ICD-10-CM | POA: Diagnosis not present

## 2021-05-29 LAB — CBC WITH DIFFERENTIAL/PLATELET
Basophils Absolute: 0.1 10*3/uL (ref 0.0–0.1)
Basophils Relative: 1.6 % (ref 0.0–3.0)
Eosinophils Absolute: 0.1 10*3/uL (ref 0.0–0.7)
Eosinophils Relative: 2.8 % (ref 0.0–5.0)
HCT: 36 % (ref 36.0–46.0)
Hemoglobin: 11.5 g/dL — ABNORMAL LOW (ref 12.0–15.0)
Lymphocytes Relative: 36.6 % (ref 12.0–46.0)
Lymphs Abs: 1.7 10*3/uL (ref 0.7–4.0)
MCHC: 31.9 g/dL (ref 30.0–36.0)
MCV: 82.8 fl (ref 78.0–100.0)
Monocytes Absolute: 0.4 10*3/uL (ref 0.1–1.0)
Monocytes Relative: 9.4 % (ref 3.0–12.0)
Neutro Abs: 2.3 10*3/uL (ref 1.4–7.7)
Neutrophils Relative %: 49.6 % (ref 43.0–77.0)
Platelets: 270 10*3/uL (ref 150.0–400.0)
RBC: 4.35 Mil/uL (ref 3.87–5.11)
RDW: 15 % (ref 11.5–15.5)
WBC: 4.6 10*3/uL (ref 4.0–10.5)

## 2021-05-29 LAB — IBC + FERRITIN
Ferritin: 10.2 ng/mL (ref 10.0–291.0)
Iron: 94 ug/dL (ref 42–145)
Saturation Ratios: 19.7 % — ABNORMAL LOW (ref 20.0–50.0)
TIBC: 476 ug/dL — ABNORMAL HIGH (ref 250.0–450.0)
Transferrin: 340 mg/dL (ref 212.0–360.0)

## 2021-05-29 LAB — VITAMIN B12: Vitamin B-12: 355 pg/mL (ref 211–911)

## 2021-06-02 ENCOUNTER — Other Ambulatory Visit: Payer: Self-pay | Admitting: Internal Medicine

## 2021-06-02 DIAGNOSIS — D509 Iron deficiency anemia, unspecified: Secondary | ICD-10-CM

## 2021-06-02 DIAGNOSIS — Z1211 Encounter for screening for malignant neoplasm of colon: Secondary | ICD-10-CM

## 2021-06-02 NOTE — Progress Notes (Signed)
Order placed for GI referral.   

## 2021-06-16 ENCOUNTER — Other Ambulatory Visit: Payer: Self-pay | Admitting: Internal Medicine

## 2021-06-24 ENCOUNTER — Encounter: Payer: Self-pay | Admitting: Internal Medicine

## 2021-08-11 ENCOUNTER — Encounter: Payer: PRIVATE HEALTH INSURANCE | Admitting: Internal Medicine

## 2021-09-07 ENCOUNTER — Other Ambulatory Visit: Payer: Self-pay | Admitting: Internal Medicine

## 2021-10-21 ENCOUNTER — Other Ambulatory Visit: Payer: Self-pay | Admitting: Internal Medicine

## 2021-10-21 DIAGNOSIS — M5432 Sciatica, left side: Secondary | ICD-10-CM

## 2021-10-21 MED ORDER — CYCLOBENZAPRINE HCL 5 MG PO TABS: 5.0000 mg | ORAL_TABLET | Freq: Every evening | ORAL | 0 refills | Status: DC | PRN

## 2021-10-28 ENCOUNTER — Ambulatory Visit (INDEPENDENT_AMBULATORY_CARE_PROVIDER_SITE_OTHER): Payer: PRIVATE HEALTH INSURANCE | Admitting: Internal Medicine

## 2021-10-28 ENCOUNTER — Encounter: Payer: Self-pay | Admitting: Internal Medicine

## 2021-10-28 VITALS — BP 108/76 | HR 96 | Temp 97.9°F | Ht 59.0 in | Wt 302.8 lb

## 2021-10-28 DIAGNOSIS — Z1211 Encounter for screening for malignant neoplasm of colon: Secondary | ICD-10-CM

## 2021-10-28 DIAGNOSIS — Z13220 Encounter for screening for lipoid disorders: Secondary | ICD-10-CM

## 2021-10-28 DIAGNOSIS — D649 Anemia, unspecified: Secondary | ICD-10-CM | POA: Diagnosis not present

## 2021-10-28 DIAGNOSIS — Z0000 Encounter for general adult medical examination without abnormal findings: Secondary | ICD-10-CM

## 2021-10-28 DIAGNOSIS — N949 Unspecified condition associated with female genital organs and menstrual cycle: Secondary | ICD-10-CM | POA: Diagnosis not present

## 2021-10-28 DIAGNOSIS — F439 Reaction to severe stress, unspecified: Secondary | ICD-10-CM

## 2021-10-28 DIAGNOSIS — C50912 Malignant neoplasm of unspecified site of left female breast: Secondary | ICD-10-CM

## 2021-10-28 LAB — CBC WITH DIFFERENTIAL/PLATELET
Basophils Absolute: 0 10*3/uL (ref 0.0–0.1)
Basophils Relative: 0.5 % (ref 0.0–3.0)
Eosinophils Absolute: 0.1 10*3/uL (ref 0.0–0.7)
Eosinophils Relative: 3.1 % (ref 0.0–5.0)
HCT: 34.7 % — ABNORMAL LOW (ref 36.0–46.0)
Hemoglobin: 11.2 g/dL — ABNORMAL LOW (ref 12.0–15.0)
Lymphocytes Relative: 40.4 % (ref 12.0–46.0)
Lymphs Abs: 1.9 10*3/uL (ref 0.7–4.0)
MCHC: 32.3 g/dL (ref 30.0–36.0)
MCV: 81.8 fl (ref 78.0–100.0)
Monocytes Absolute: 0.3 10*3/uL (ref 0.1–1.0)
Monocytes Relative: 7.2 % (ref 3.0–12.0)
Neutro Abs: 2.3 10*3/uL (ref 1.4–7.7)
Neutrophils Relative %: 48.8 % (ref 43.0–77.0)
Platelets: 284 10*3/uL (ref 150.0–400.0)
RBC: 4.24 Mil/uL (ref 3.87–5.11)
RDW: 14 % (ref 11.5–15.5)
WBC: 4.7 10*3/uL (ref 4.0–10.5)

## 2021-10-28 LAB — BASIC METABOLIC PANEL
BUN: 14 mg/dL (ref 6–23)
CO2: 29 mEq/L (ref 19–32)
Calcium: 9.2 mg/dL (ref 8.4–10.5)
Chloride: 103 mEq/L (ref 96–112)
Creatinine, Ser: 0.69 mg/dL (ref 0.40–1.20)
GFR: 104.46 mL/min (ref >60.00)
Glucose, Bld: 88 mg/dL (ref 70–99)
Potassium: 4.2 mEq/L (ref 3.5–5.1)
Sodium: 138 mEq/L (ref 135–145)

## 2021-10-28 LAB — HEPATIC FUNCTION PANEL
ALT: 13 U/L (ref 0–35)
AST: 14 U/L (ref 0–37)
Albumin: 4.1 g/dL (ref 3.5–5.2)
Alkaline Phosphatase: 93 U/L (ref 39–117)
Bilirubin, Direct: 0.1 mg/dL (ref 0.0–0.3)
Total Bilirubin: 0.6 mg/dL (ref 0.2–1.2)
Total Protein: 7.7 g/dL (ref 6.0–8.3)

## 2021-10-28 LAB — LIPID PANEL
Cholesterol: 153 mg/dL (ref 0–200)
HDL: 55.3 mg/dL (ref >39.00)
LDL Cholesterol: 87 mg/dL (ref 0–99)
NonHDL: 97.74
Total CHOL/HDL Ratio: 3
Triglycerides: 56 mg/dL (ref 0.0–149.0)
VLDL: 11.2 mg/dL (ref 0.0–40.0)

## 2021-10-28 LAB — IBC + FERRITIN
Ferritin: 13.9 ng/mL (ref 10.0–291.0)
Iron: 28 ug/dL — ABNORMAL LOW (ref 42–145)
Saturation Ratios: 5.8 % — ABNORMAL LOW (ref 20.0–50.0)
TIBC: 483 ug/dL — ABNORMAL HIGH (ref 250.0–450.0)
Transferrin: 345 mg/dL (ref 212.0–360.0)

## 2021-10-28 MED ORDER — MUPIROCIN 2 % EX OINT
1.0000 | TOPICAL_OINTMENT | Freq: Two times a day (BID) | CUTANEOUS | 0 refills | Status: DC
Start: 2021-10-28 — End: 2022-08-04

## 2021-10-28 NOTE — Progress Notes (Signed)
Patient ID: Brittney Nash, female   DOB: 09-Aug-1975, 46 y.o.   MRN: [id number] ? ? ?Subjective:  ? ? Patient ID: Brittney Nash, female    DOB: 04-10-1976, 46 y.o.   MRN: [id number] ? ?Patient here for her physical exam.  ? ?Chief Complaint  ?Patient presents with  ? Annual Exam  ? .  ? ?HPI ?She reports she is doing relatively well.  Working.  Some increased stress.  Overall appears to be handling things relatively well.  Stays active.  Does not exercise regularly.  No chest pain or sob reported.  No abdominal pain or bowel change reported.  Has not had her colonoscopy.  Is overdue for f/u with her oncologist and overdue mammogram.  Ok to schedule.  Right nasal lesion.  Present for several days.  ? ? ?Past Medical History:  ?Diagnosis Date  ? Arthritis 2013  ? Breast cancer ([place]) 08/26/2016  ? History of femur fracture 2004  ? s/p surgery with rod placement  ? ?Past Surgical History:  ?Procedure Laterality Date  ? BREAST LUMPECTOMY Left 11/02/2016  ? LEG SURGERY Left   ? ?Family History  ?Problem Relation Age of Onset  ? Arthritis Mother   ? Heart disease Paternal Grandfather   ? ?Social History  ? ?Socioeconomic History  ? Marital status: Single  ?  Spouse name: Not on file  ? Number of children: Not on file  ? Years of education: Not on file  ? Highest education level: Not on file  ?Occupational History  ? Not on file  ?Tobacco Use  ? Smoking status: Never  ? Smokeless tobacco: Never  ?Substance and Sexual Activity  ? Alcohol use: Yes  ?  Comment: occ 1-2 times a month   ? Drug use: No  ? Sexual activity: Not on file  ?Other Topics Concern  ? Not on file  ?Social History Narrative  ? Not on file  ? ?Social Determinants of Health  ? ?Financial Resource Strain: Not on file  ?Food Insecurity: Not on file  ?Transportation Needs: Not on file  ?Physical Activity: Not on file  ?Stress: Not on file  ?Social Connections: Not on file  ? ? ? ?Review of Systems  ?Constitutional:  Negative for appetite change and unexpected weight  change.  ?HENT:  Negative for congestion, sinus pressure and sore throat.   ?Eyes:  Negative for pain and visual disturbance.  ?Respiratory:  Negative for cough, chest tightness and shortness of breath.   ?Cardiovascular:  Negative for chest pain, palpitations and leg swelling.  ?Gastrointestinal:  Negative for abdominal pain, diarrhea, nausea and vomiting.  ?Genitourinary:  Negative for difficulty urinating and dysuria.  ?Musculoskeletal:  Negative for joint swelling and myalgias.  ?Skin:  Negative for color change and rash.  ?Neurological:  Negative for dizziness, light-headedness and headaches.  ?Hematological:  Negative for adenopathy. Does not bruise/bleed easily.  ?Psychiatric/Behavioral:  Negative for agitation and dysphoric mood.   ? ?   ?Objective:  ?  ? ?BP 108/76 (BP Location: Left Arm, Patient Position: Sitting, Cuff Size: Large)   Pulse 96   Temp 97.9 ?F (36.6 ?C) (Oral)   Ht '4\' 11"'$  (1.499 m)   Wt (!) 302 lb 12.8 oz (137.3 kg)   SpO2 98%   BMI 61.16 kg/m?  ?Wt Readings from Last 3 Encounters:  ?10/28/21 (!) 302 lb 12.8 oz (137.3 kg)  ?04/24/21 (!) 312 lb (141.5 kg)  ?01/08/21 (!) 306 lb (138.8 kg)  ? ? ?Physical Exam ?  Vitals reviewed.  ?Constitutional:   ?   General: She is not in acute distress. ?   Appearance: Normal appearance.  ?HENT:  ?   Head: Normocephalic and atraumatic.  ?   Right Ear: External ear normal.  ?   Left Ear: External ear normal.  ?Eyes:  ?   General: No scleral icterus.    ?   Right eye: No discharge.     ?   Left eye: No discharge.  ?   Conjunctiva/sclera: Conjunctivae normal.  ?Neck:  ?   Thyroid: No thyromegaly.  ?Cardiovascular:  ?   Rate and Rhythm: Normal rate and regular rhythm.  ?Pulmonary:  ?   Effort: No respiratory distress.  ?   Breath sounds: Normal breath sounds. No wheezing.  ?   Comments: Left breast:  well healed incision site.  Palpable fullness/increased tissue - s/p surgery.  No nipple discharge or nipple retraction present.  Could not appreciate any  axillary adenopathy.  Right breast - no nipple discharge or nipple retraction present.  Could not appreciate any distinct nodules or axillary adenopathy.  ?Abdominal:  ?   General: Bowel sounds are normal.  ?   Palpations: Abdomen is soft.  ?   Tenderness: There is no abdominal tenderness.  ?Musculoskeletal:     ?   General: No swelling or tenderness.  ?   Cervical back: Neck supple. No tenderness.  ?Lymphadenopathy:  ?   Cervical: No cervical adenopathy.  ?Skin: ?   Findings: No erythema or rash.  ?Neurological:  ?   Mental Status: She is alert.  ?Psychiatric:     ?   Mood and Affect: Mood normal.     ?   Behavior: Behavior normal.  ? ? ? ?Outpatient Encounter Medications as of 10/28/2021  ?Medication Sig  ? clotrimazole-betamethasone (LOTRISONE) cream Apply 1 application topically daily as needed. Do not use in the same spot for more than 7-10 days in a row.  ? cyanocobalamin 1000 MCG tablet Take 1,000 mcg by mouth daily.  ? cyclobenzaprine (FLEXERIL) 5 MG tablet Take 1 tablet (5 mg total) by mouth at bedtime as needed for muscle spasms.  ? escitalopram (LEXAPRO) 20 MG tablet TAKE 1 TABLET(20 MG) BY MOUTH DAILY  ? ferrous sulfate 325 (65 FE) MG tablet Take 325 mg by mouth daily with breakfast.  ? mupirocin ointment (BACTROBAN) 2 % Apply 1 application. topically 2 (two) times daily.  ? nystatin cream (MYCOSTATIN) Apply 1 application topically 2 (two) times daily.  ? ?No facility-administered encounter medications on file as of 10/28/2021.  ?  ? ?Lab Results  ?Component Value Date  ? WBC 4.7 10/28/2021  ? HGB 11.2 (L) 10/28/2021  ? HCT 34.7 (L) 10/28/2021  ? PLT 284.0 10/28/2021  ? GLUCOSE 88 10/28/2021  ? CHOL 153 10/28/2021  ? TRIG 56.0 10/28/2021  ? HDL 55.30 10/28/2021  ? [place] 87 10/28/2021  ? ALT 13 10/28/2021  ? AST 14 10/28/2021  ? NA 138 10/28/2021  ? K 4.2 10/28/2021  ? CL 103 10/28/2021  ? CREATININE 0.69 10/28/2021  ? BUN 14 10/28/2021  ? CO2 29 10/28/2021  ? TSH 1.80 04/24/2021  ? INR 1.1 09/02/2011   ? ? ?MR LUMBAR SPINE WO CONTRAST ? ?Result Date: 08/01/2018 ?CLINICAL DATA:  Low back pain radiating into the left leg to the foot. EXAM: MRI LUMBAR SPINE WITHOUT CONTRAST TECHNIQUE: Multiplanar, multisequence MR imaging of the lumbar spine was performed. No intravenous contrast was administered. COMPARISON:  None. FINDINGS:  Segmentation: Assumed standard. The last well-formed disc space is designated L5-S1 for the purposes of this report. Alignment: Accentuated lumbar lordosis. Trace anterolisthesis at L5-S1. Vertebrae:  No fracture, evidence of discitis, or bone lesion. Conus medullaris and cauda equina: Conus extends to the L1 level. Conus and cauda equina appear normal. Paraspinal and other soft tissues: Incompletely visualized left adnexal cyst measuring at least 3.1 cm. Disc levels: T11-T12: Disc desiccation with minimal bulging.  No stenosis. T12-L1:  Negative. L1-L2:  Negative. L2-L3:  Negative. L3-L4:  Disc desiccation with minimal bulging.  No stenosis. L4-L5:  Negative. L5-S1: Disc desiccation with shallow broad-based posterior disc protrusion. Moderate bilateral facet arthropathy with prominent right facet joint effusion. Moderate bilateral neuroforaminal stenosis. No spinal canal stenosis. IMPRESSION: 1. Degenerative changes at L5-S1 with moderate bilateral neuroforaminal stenosis. Moderate facet arthropathy at this level with prominent right facet joint effusion that could reflect instability. 2. Incompletely visualized probably benign left adnexal cyst measuring at least 3.1 cm. Recommend follow-up pelvic ultrasound in 6-12 weeks. This recommendation follows ACR consensus guidelines: White Paper of the ACR Incidental Findings Committee II on Adnexal Findings. J Am Coll Radiol [phone]. Electronically Signed   By: Titus Dubin M.D.   On: 08/01/2018 16:13  ? ? ?   ?Assessment & Plan:  ? ?Problem List Items Addressed This Visit   ? ? Adnexal cyst  ?  Had noted left adnexal cyst on previous MRI  07/2018.  Saw gyn.  Had f/u pelvic ultrasound 08/2018.  Continued to f/u with gyn throughout her pregnancy in 2020.  She is planning to schedule a f/u with gyn - Dr Ouida Sills.  ? ?  ?  ? Breast cancer ([place])  ?  Was

## 2021-10-28 NOTE — Assessment & Plan Note (Signed)
Physical today 10/28/21.   ?

## 2021-10-29 ENCOUNTER — Encounter: Payer: Self-pay | Admitting: Internal Medicine

## 2021-11-01 ENCOUNTER — Encounter: Payer: Self-pay | Admitting: Internal Medicine

## 2021-11-01 DIAGNOSIS — Z1211 Encounter for screening for malignant neoplasm of colon: Secondary | ICD-10-CM | POA: Insufficient documentation

## 2021-11-01 NOTE — Assessment & Plan Note (Signed)
Was followed by oncology. Appears is overdue f/u.  Was on tamoxifen.  Need to confirm if still supposed to be taking.  Needs f/u mammogram and f/u with oncology.  She reports was seeing Dr Marolyn Hammock.  Is no longer practicing. Offered to schedule.  She plans to call and schedule f/u with oncology.  Will notify me if unable to schedule.   ?

## 2021-11-01 NOTE — Assessment & Plan Note (Signed)
Appears to be doing well on lexapro.  Follow.  ?

## 2021-11-01 NOTE — Assessment & Plan Note (Signed)
Had noted left adnexal cyst on previous MRI 07/2018.  Saw gyn.  Had f/u pelvic ultrasound 08/2018.  Continued to f/u with gyn throughout her pregnancy in 2020.  She is planning to schedule a f/u with gyn - Dr Ouida Sills.  ?

## 2021-11-01 NOTE — Assessment & Plan Note (Signed)
Due colonoscopy.  

## 2021-11-02 ENCOUNTER — Telehealth: Payer: Self-pay

## 2021-11-02 NOTE — Telephone Encounter (Signed)
Called patient no answer voicemail was full ?

## 2021-11-03 ENCOUNTER — Telehealth: Payer: Self-pay

## 2021-11-03 NOTE — Telephone Encounter (Signed)
CALLED NO ANSWER AND VOICEMAIL FULL ?

## 2021-11-03 NOTE — Telephone Encounter (Signed)
CALLED PATIENT NO ANSWER LEFT VOICEMAIL FOR A CALL BACK °Letter sent °

## 2021-11-20 ENCOUNTER — Other Ambulatory Visit: Payer: Self-pay

## 2021-12-12 ENCOUNTER — Telehealth: Payer: Self-pay | Admitting: Internal Medicine

## 2021-12-12 NOTE — Telephone Encounter (Signed)
Please call Brittney Nash and let her know that GI has been trying to contact her regarding scheduling a colonoscopy.  If able to reach, please give her contact information to get scheduled.

## 2021-12-12 NOTE — Telephone Encounter (Signed)
-----   Message from Ashley Jacobs sent at 11/18/2021 10:10 AM EDT ----- Regarding: RE: GI referral Good morning!  Happy Wednesday!  Yes referral to [facility] was placed and on 06/02/2022. I marked appt on referral on 10/14/2021 at 09:30 am.   I'm also seeing pt had another referral to [facility] on 11/01/2021 they have tried to reach pt on 11/02/2021 , 11/03/2021 and letter was sent out. Pt has not reached back to sch appt.  Please advise and thank you!  ----- Message ----- From: Einar Pheasant, MD Sent: 10/30/2021   5:24 AM EDT To: Ashley Jacobs Subject: GI referral                                    I had placed an order for referral to GI - Dr Haig [facility]). In 05/2021.  It does not appear appt has been scheduled. Please let me know if I need to do anything more.    Thanks.  Dr Nicki Reaper

## 2021-12-14 NOTE — Telephone Encounter (Signed)
Lm for pt to cb.

## 2021-12-15 NOTE — Telephone Encounter (Signed)
Lm for pt to cb.

## 2021-12-23 NOTE — Telephone Encounter (Signed)
Lm for pt to cb.

## 2022-01-18 ENCOUNTER — Other Ambulatory Visit: Payer: Self-pay | Admitting: Internal Medicine

## 2022-01-19 MED ORDER — CLOTRIMAZOLE-BETAMETHASONE 1-0.05 % EX CREA: 1.0000 | TOPICAL_CREAM | Freq: Every day | CUTANEOUS | 0 refills | Status: DC | PRN

## 2022-04-30 ENCOUNTER — Ambulatory Visit: Payer: PRIVATE HEALTH INSURANCE | Admitting: Internal Medicine

## 2022-06-24 ENCOUNTER — Other Ambulatory Visit: Payer: Self-pay | Admitting: Internal Medicine

## 2022-06-24 DIAGNOSIS — M5432 Sciatica, left side: Secondary | ICD-10-CM

## 2022-06-25 MED ORDER — CYCLOBENZAPRINE HCL 5 MG PO TABS: 5.0000 mg | ORAL_TABLET | Freq: Every evening | ORAL | 0 refills | Status: DC | PRN

## 2022-06-30 ENCOUNTER — Ambulatory Visit: Payer: PRIVATE HEALTH INSURANCE | Admitting: Internal Medicine

## 2022-06-30 DIAGNOSIS — N644 Mastodynia: Secondary | ICD-10-CM

## 2022-08-02 ENCOUNTER — Encounter: Payer: Self-pay | Admitting: Internal Medicine

## 2022-08-04 ENCOUNTER — Ambulatory Visit (INDEPENDENT_AMBULATORY_CARE_PROVIDER_SITE_OTHER): Payer: PRIVATE HEALTH INSURANCE | Admitting: Internal Medicine

## 2022-08-04 ENCOUNTER — Encounter: Payer: Self-pay | Admitting: Internal Medicine

## 2022-08-04 VITALS — BP 120/74 | HR 88 | Temp 97.6°F | Resp 16 | Ht 59.0 in | Wt 311.0 lb

## 2022-08-04 DIAGNOSIS — Z13220 Encounter for screening for lipoid disorders: Secondary | ICD-10-CM

## 2022-08-04 DIAGNOSIS — C50912 Malignant neoplasm of unspecified site of left female breast: Secondary | ICD-10-CM

## 2022-08-04 DIAGNOSIS — D649 Anemia, unspecified: Secondary | ICD-10-CM | POA: Diagnosis not present

## 2022-08-04 DIAGNOSIS — N949 Unspecified condition associated with female genital organs and menstrual cycle: Secondary | ICD-10-CM

## 2022-08-04 DIAGNOSIS — N644 Mastodynia: Secondary | ICD-10-CM | POA: Diagnosis not present

## 2022-08-04 LAB — BASIC METABOLIC PANEL
BUN: 12 mg/dL (ref 6–23)
CO2: 28 mEq/L (ref 19–32)
Calcium: 8.9 mg/dL (ref 8.4–10.5)
Chloride: 103 mEq/L (ref 96–112)
Creatinine, Ser: 0.59 mg/dL (ref 0.40–1.20)
GFR: 107.89 mL/min (ref >60.00)
Glucose, Bld: 83 mg/dL (ref 70–99)
Potassium: 4.3 mEq/L (ref 3.5–5.1)
Sodium: 141 mEq/L (ref 135–145)

## 2022-08-04 LAB — CBC WITH DIFFERENTIAL/PLATELET
Basophils Absolute: 0 10*3/uL (ref 0.0–0.1)
Basophils Relative: 0.7 % (ref 0.0–3.0)
Eosinophils Absolute: 0.2 10*3/uL (ref 0.0–0.7)
Eosinophils Relative: 3.4 % (ref 0.0–5.0)
HCT: 33.6 % — ABNORMAL LOW (ref 36.0–46.0)
Hemoglobin: 10.8 g/dL — ABNORMAL LOW (ref 12.0–15.0)
Lymphocytes Relative: 35.5 % (ref 12.0–46.0)
Lymphs Abs: 2 10*3/uL (ref 0.7–4.0)
MCHC: 32.1 g/dL (ref 30.0–36.0)
MCV: 81 fl (ref 78.0–100.0)
Monocytes Absolute: 0.5 10*3/uL (ref 0.1–1.0)
Monocytes Relative: 8.1 % (ref 3.0–12.0)
Neutro Abs: 3 10*3/uL (ref 1.4–7.7)
Neutrophils Relative %: 52.3 % (ref 43.0–77.0)
Platelets: 269 10*3/uL (ref 150.0–400.0)
RBC: 4.15 Mil/uL (ref 3.87–5.11)
RDW: 14.9 % (ref 11.5–15.5)
WBC: 5.7 10*3/uL (ref 4.0–10.5)

## 2022-08-04 LAB — LIPID PANEL
Cholesterol: 156 mg/dL (ref 0–200)
HDL: 63.4 mg/dL (ref >39.00)
LDL Cholesterol: 82 mg/dL (ref 0–99)
NonHDL: 93.06
Total CHOL/HDL Ratio: 2
Triglycerides: 56 mg/dL (ref 0.0–149.0)
VLDL: 11.2 mg/dL (ref 0.0–40.0)

## 2022-08-04 LAB — HEPATIC FUNCTION PANEL
ALT: 13 U/L (ref 0–35)
AST: 14 U/L (ref 0–37)
Albumin: 4 g/dL (ref 3.5–5.2)
Alkaline Phosphatase: 92 U/L (ref 39–117)
Bilirubin, Direct: 0.2 mg/dL (ref 0.0–0.3)
Total Bilirubin: 0.9 mg/dL (ref 0.2–1.2)
Total Protein: 7.4 g/dL (ref 6.0–8.3)

## 2022-08-04 LAB — IBC + FERRITIN
Ferritin: 6.2 ng/mL — ABNORMAL LOW (ref 10.0–291.0)
Iron: 57 ug/dL (ref 42–145)
Saturation Ratios: 11.8 % — ABNORMAL LOW (ref 20.0–50.0)
TIBC: 481.6 ug/dL — ABNORMAL HIGH (ref 250.0–450.0)
Transferrin: 344 mg/dL (ref 212.0–360.0)

## 2022-08-04 LAB — TSH: TSH: 1.73 u[IU]/mL (ref 0.35–5.50)

## 2022-08-04 NOTE — Progress Notes (Signed)
Subjective:    Patient ID: Brittney Nash, female    DOB: 10-26-1975, 47 y.o.   MRN: [id number]  Patient here for  Chief Complaint  Patient presents with   Breast Pain    HPI Here for work in appt - left breast tenderness. She has a history of a stage Ia grade 1 ER/PR positive HER-2 negative breast cancer on the left diagnosed March 2018.  She has noticed over the past couple of weeks - intermittent pain - left breast.  No nipple discharge.  No new palpable masses or nodules.  Has not followed up with oncology.  Has been off tamoxifen for one year.  States otherwise doing ok.  No chest pain.  Breathing stable.    Past Medical History:  Diagnosis Date   Arthritis 2013   Breast cancer ([place]) 08/26/2016   History of femur fracture 2004   s/p surgery with rod placement   Past Surgical History:  Procedure Laterality Date   BREAST LUMPECTOMY Left 11/02/2016   LEG SURGERY Left    Family History  Problem Relation Age of Onset   Arthritis Mother    Heart disease Paternal Grandfather    Social History   Socioeconomic History   Marital status: Single    Spouse name: Not on file   Number of children: Not on file   Years of education: Not on file   Highest education level: Not on file  Occupational History   Not on file  Tobacco Use   Smoking status: Never   Smokeless tobacco: Never  Substance and Sexual Activity   Alcohol use: Yes    Comment: occ 1-2 times a month    Drug use: No   Sexual activity: Not on file  Other Topics Concern   Not on file  Social History Narrative   Not on file   Social Determinants of Health   Financial Resource Strain: Not on file  Food Insecurity: Not on file  Transportation Needs: Not on file  Physical Activity: Not on file  Stress: Not on file  Social Connections: Not on file     Review of Systems  Constitutional:  Negative for appetite change and unexpected weight change.  HENT:  Negative for congestion and sinus pressure.    Respiratory:  Negative for cough, chest tightness and shortness of breath.   Cardiovascular:  Negative for chest pain, palpitations and leg swelling.  Gastrointestinal:  Negative for abdominal pain, diarrhea, nausea and vomiting.  Genitourinary:  Negative for difficulty urinating and dysuria.  Musculoskeletal:  Negative for joint swelling and myalgias.  Skin:  Negative for color change and rash.  Neurological:  Negative for dizziness and headaches.  Psychiatric/Behavioral:  Negative for agitation and dysphoric mood.        Objective:     BP 120/74   Pulse 88   Temp 97.6 F (36.4 C)   Resp 16   Ht 4' 11"$  (1.499 m)   Wt (!) 311 lb (141.1 kg)   SpO2 99%   BMI 62.81 kg/m  Wt Readings from Last 3 Encounters:  08/04/22 (!) 311 lb (141.1 kg)  10/28/21 (!) 302 lb 12.8 oz (137.3 kg)  04/24/21 (!) 312 lb (141.5 kg)    Physical Exam Vitals reviewed.  Constitutional:      General: She is not in acute distress.    Appearance: Normal appearance.  HENT:     Head: Normocephalic and atraumatic.     Right Ear: External ear normal.  Left Ear: External ear normal.  Eyes:     General: No scleral icterus.       Right eye: No discharge.        Left eye: No discharge.     Conjunctiva/sclera: Conjunctivae normal.  Neck:     Thyroid: No thyromegaly.  Cardiovascular:     Rate and Rhythm: Normal rate and regular rhythm.  Pulmonary:     Effort: No respiratory distress.     Breath sounds: Normal breath sounds. No wheezing.     Comments: Breasts:  no nipple discharge or nipple retraction present right breast.  No palpable nodules or axillary adenopathy.  Left breast - s/p chronic changes - previous lumpectomy.  Increased tenderness -  upper inner quadrant left breast.  Abdominal:     General: Bowel sounds are normal.     Palpations: Abdomen is soft.     Tenderness: There is no abdominal tenderness.  Musculoskeletal:        General: No swelling or tenderness.     Cervical back: Neck  supple. No tenderness.  Lymphadenopathy:     Cervical: No cervical adenopathy.  Skin:    Findings: No erythema or rash.  Neurological:     Mental Status: She is alert.  Psychiatric:        Mood and Affect: Mood normal.        Behavior: Behavior normal.      Outpatient Encounter Medications as of 08/04/2022  Medication Sig   cyanocobalamin 1000 MCG tablet Take 1,000 mcg by mouth daily.   cyclobenzaprine (FLEXERIL) 5 MG tablet Take 1 tablet (5 mg total) by mouth at bedtime as needed for muscle spasms.   escitalopram (LEXAPRO) 20 MG tablet TAKE 1 TABLET(20 MG) BY MOUTH DAILY   ferrous sulfate 325 (65 FE) MG tablet Take 325 mg by mouth daily with breakfast. (Patient not taking: Reported on 08/04/2022)   [DISCONTINUED] clotrimazole-betamethasone (LOTRISONE) cream Apply 1 Application topically daily as needed. Do not use in the same spot for more than 7-10 days in a row. (Patient not taking: Reported on 08/04/2022)   [DISCONTINUED] mupirocin ointment (BACTROBAN) 2 % Apply 1 application. topically 2 (two) times daily. (Patient not taking: Reported on 08/04/2022)   [DISCONTINUED] nystatin cream (MYCOSTATIN) Apply 1 application topically 2 (two) times daily. (Patient not taking: Reported on 08/04/2022)   No facility-administered encounter medications on file as of 08/04/2022.     Lab Results  Component Value Date   WBC 5.7 08/04/2022   HGB 10.8 (L) 08/04/2022   HCT 33.6 (L) 08/04/2022   PLT 269.0 08/04/2022   GLUCOSE 83 08/04/2022   CHOL 156 08/04/2022   TRIG 56.0 08/04/2022   HDL 63.40 08/04/2022   LDLCALC 82 08/04/2022   ALT 13 08/04/2022   AST 14 08/04/2022   NA 141 08/04/2022   K 4.3 08/04/2022   CL 103 08/04/2022   CREATININE 0.59 08/04/2022   BUN 12 08/04/2022   CO2 28 08/04/2022   TSH 1.73 08/04/2022   INR 1.1 09/02/2011    MR LUMBAR SPINE WO CONTRAST  Result Date: 08/01/2018 CLINICAL DATA:  Low back pain radiating into the left leg to the foot. EXAM: MRI LUMBAR SPINE WITHOUT  CONTRAST TECHNIQUE: Multiplanar, multisequence MR imaging of the lumbar spine was performed. No intravenous contrast was administered. COMPARISON:  None. FINDINGS: Segmentation: Assumed standard. The last well-formed disc space is designated L5-S1 for the purposes of this report. Alignment: Accentuated lumbar lordosis. Trace anterolisthesis at L5-S1. Vertebrae:  No fracture,  evidence of discitis, or bone lesion. Conus medullaris and cauda equina: Conus extends to the L1 level. Conus and cauda equina appear normal. Paraspinal and other soft tissues: Incompletely visualized left adnexal cyst measuring at least 3.1 cm. Disc levels: T11-T12: Disc desiccation with minimal bulging.  No stenosis. T12-L1:  Negative. L1-L2:  Negative. L2-L3:  Negative. L3-L4:  Disc desiccation with minimal bulging.  No stenosis. L4-L5:  Negative. L5-S1: Disc desiccation with shallow broad-based posterior disc protrusion. Moderate bilateral facet arthropathy with prominent right facet joint effusion. Moderate bilateral neuroforaminal stenosis. No spinal canal stenosis. IMPRESSION: 1. Degenerative changes at L5-S1 with moderate bilateral neuroforaminal stenosis. Moderate facet arthropathy at this level with prominent right facet joint effusion that could reflect instability. 2. Incompletely visualized probably benign left adnexal cyst measuring at least 3.1 cm. Recommend follow-up pelvic ultrasound in 6-12 weeks. This recommendation follows ACR consensus guidelines: White Paper of the ACR Incidental Findings Committee II on Adnexal Findings. J Am Coll Radiol [phone]. Electronically Signed   By: Titus Dubin M.D.   On: 08/01/2018 16:13       Assessment & Plan:  Breast pain, left Assessment & Plan: Exam as outlined.  Schedule diagnostic mammogram and ultrasound. Overdue - obtain bilateral mammogram.   Orders: -     MM DIAG BREAST TOMO BILATERAL; Future -     US BREAST LTD UNI RIGHT INC AXILLA; Future -     US BREAST LTD  UNI LEFT INC AXILLA; Future -     Ambulatory referral to Hematology / Oncology  Anemia, unspecified type Assessment & Plan: Check cbc.    Orders: -     CBC with Differential/Platelet -     Hepatic function panel -     Basic metabolic panel -     TSH -     IBC + Ferritin  Screening cholesterol level -     Lipid panel  Malignant neoplasm of left female breast, unspecified estrogen receptor status, unspecified site of breast [facility]) Assessment & Plan: Was followed by oncology. Appears is overdue f/u.  Was on tamoxifen. Not taking now.  She reports was seeing Dr Marolyn Hammock.  Is no longer practicing. Previously offered to schedule.  She has not scheduled.  Will schedule diagnostic mammogram and ultrasound and needs referral to oncology.   Orders: -     MM DIAG BREAST TOMO BILATERAL; Future -     US BREAST LTD UNI RIGHT INC AXILLA; Future -     US BREAST LTD UNI LEFT INC AXILLA; Future -     Ambulatory referral to Hematology / Oncology  Adnexal cyst Assessment & Plan: Had noted left adnexal cyst on previous MRI 07/2018.  Saw gyn.  Had f/u pelvic ultrasound 08/2018.  Continued to f/u with gyn throughout her pregnancy in 2020.  She is planning to schedule a f/u with gyn - Dr Ouida Sills. Needs f/u scheduled.      Einar Pheasant, MD

## 2022-08-08 ENCOUNTER — Encounter: Payer: Self-pay | Admitting: Internal Medicine

## 2022-08-08 NOTE — Assessment & Plan Note (Signed)
Was followed by oncology. Appears is overdue f/u.  Was on tamoxifen. Not taking now.  She reports was seeing Dr Marolyn Hammock.  Is no longer practicing. Previously offered to schedule.  She has not scheduled.  Will schedule diagnostic mammogram and ultrasound and needs referral to oncology.

## 2022-08-08 NOTE — Assessment & Plan Note (Signed)
Check cbc 

## 2022-08-08 NOTE — Assessment & Plan Note (Signed)
Exam as outlined.  Schedule diagnostic mammogram and ultrasound. Overdue - obtain bilateral mammogram.

## 2022-08-08 NOTE — Assessment & Plan Note (Signed)
Had noted left adnexal cyst on previous MRI 07/2018.  Saw gyn.  Had f/u pelvic ultrasound 08/2018.  Continued to f/u with gyn throughout her pregnancy in 2020.  She is planning to schedule a f/u with gyn - Dr Ouida Sills. Needs f/u scheduled.

## 2022-08-09 ENCOUNTER — Other Ambulatory Visit: Payer: Self-pay | Admitting: Internal Medicine

## 2022-08-11 ENCOUNTER — Ambulatory Visit (INDEPENDENT_AMBULATORY_CARE_PROVIDER_SITE_OTHER): Payer: PRIVATE HEALTH INSURANCE | Admitting: Family

## 2022-08-11 ENCOUNTER — Ambulatory Visit
Admission: RE | Admit: 2022-08-11 | Discharge: 2022-08-11 | Disposition: A | Discharge status: Home / Self Care | Payer: PRIVATE HEALTH INSURANCE | Source: Ambulatory Visit | Attending: Family | Admitting: Family

## 2022-08-11 ENCOUNTER — Encounter: Payer: Self-pay | Admitting: Family

## 2022-08-11 VITALS — BP 138/88 | HR 102 | Temp 98.1°F | Ht 59.0 in | Wt 314.0 lb

## 2022-08-11 DIAGNOSIS — K1120 Sialoadenitis, unspecified: Secondary | ICD-10-CM | POA: Insufficient documentation

## 2022-08-11 MED ORDER — AMOXICILLIN-POT CLAVULANATE 875-125 MG PO TABS: 1.0000 | ORAL_TABLET | Freq: Two times a day (BID) | ORAL | 0 refills | Status: AC

## 2022-08-11 NOTE — Patient Instructions (Signed)
Concern for sinus infection versus infection of the parotid gland which is near your right ear.  We will start Augmentin ( antibiotic).  I ordered a stat ultrasound to further evaluate for the parotid gland.  Ensure to take probiotics while on antibiotics and also for 2 weeks after completion. This can either be by eating yogurt daily or taking a probiotic supplement over the counter such as Culturelle.It is important to re-colonize the gut with good bacteria and also to prevent any diarrheal infections associated with antibiotic use.

## 2022-08-11 NOTE — Progress Notes (Signed)
Assessment & Plan:  Parotiditis Assessment & Plan: Presentation consistent with parotitis based on focal area of pain right preauricular area which was reproduced on exam.  Fortunately no systemic or neurologic features. Pain is not bilateral which is more likely in viral culprit such as mumps.  Start to cover for infectitous parotidis.  Alternatively differentials include sinusitis,  sialolithiasis ( from sinus medication?) . Less likely dental abscess due to absence of dental pain. Less likely TMJ due to poor response from tylenol, ibuprofen.   Orders: -     US SOFT TISSUE HEAD & NECK (NON-THYROID); Future -     Amoxicillin-Pot Clavulanate; Take 1 tablet by mouth 2 (two) times daily for 7 days.  Dispense: 14 tablet; Refill: 0     Return precautions given.   Risks, benefits, and alternatives of the medications and treatment plan prescribed today were discussed, and patient expressed understanding.   Education regarding symptom management and diagnosis given to patient on AVS either electronically or printed.  No follow-ups on file.  Mable Paris, FNP  Subjective:    Patient ID: Brittney Nash, female    DOB: [birth year], 46 y.o.   MRN: [id number]  CC: Brittney Nash is a 47 y.o. female who presents today for an acute visit.    HPI: Complains of right sided facial pain x 6 days, worsening,   Endorses tactile warmth.   The only relief is when when leans her head to the left.  No pain with eating. She doesn't grind her teeth. No recent dental exam.   She has felt this way in the past when she has sinus infection.   She started sinus calm homeopathic medication and then tylenol sinus with some relief.   No trouble swallowing, fever, sinus congestion,  teeth sensitivity, HA, vision loss, numbness, vomiting, rash, purulent discharge, neck pain.   No trauma, falls.   No recent antibiotic.  20 year old child at home without illness at this time.    Treated for TMJ at  urgent care started on Tylenol, 1000 mg with ibuprofen, 600 mg every 6 hours.   She is not on biphosphonate.    Allergies: Patient has no known allergies. Current Outpatient Medications on File Prior to Visit  Medication Sig Dispense Refill   cyanocobalamin 1000 MCG tablet Take 1,000 mcg by mouth daily.     cyclobenzaprine (FLEXERIL) 5 MG tablet Take 1 tablet (5 mg total) by mouth at bedtime as needed for muscle spasms. 30 tablet 0   escitalopram (LEXAPRO) 20 MG tablet Take 1 tablet by mouth once daily 60 tablet 0   ferrous sulfate 325 (65 FE) MG tablet Take 325 mg by mouth daily with breakfast.     methocarbamol (ROBAXIN) 500 MG tablet Take by mouth.     No current facility-administered medications on file prior to visit.    Review of Systems  Constitutional:  Negative for chills and fever.  HENT:  Negative for congestion, ear discharge, ear pain, facial swelling and trouble swallowing.   Respiratory:  Negative for cough.   Cardiovascular:  Negative for chest pain and palpitations.  Gastrointestinal:  Negative for nausea and vomiting.  Musculoskeletal:  Negative for neck pain.  Skin:  Negative for rash and wound.  Neurological:  Negative for numbness.      Objective:    BP 138/88   Pulse (!) 102   Temp 98.1 F (36.7 C) (Oral)   Ht 4' 11"$  (1.499 m)   Wt (!) 314  lb (142.4 kg)   LMP  (LMP Unknown)   SpO2 97%   BMI 63.42 kg/m   BP Readings from Last 3 Encounters:  08/11/22 138/88  08/04/22 120/74  10/28/21 108/76   Wt Readings from Last 3 Encounters:  08/11/22 (!) 314 lb (142.4 kg)  08/04/22 (!) 311 lb (141.1 kg)  10/28/21 (!) 302 lb 12.8 oz (137.3 kg)    Physical Exam Vitals reviewed.  Constitutional:      Appearance: She is well-developed.  HENT:     Head: Normocephalic and atraumatic.     Jaw: Tenderness present. No trismus, swelling or pain on movement.     Right Ear: Hearing, tympanic membrane, ear canal and external ear normal. No decreased hearing noted.  No drainage, swelling or tenderness. No middle ear effusion. No foreign body. Tympanic membrane is not erythematous or bulging.     Left Ear: Hearing, tympanic membrane, ear canal and external ear normal. No decreased hearing noted. No drainage, swelling or tenderness.  No middle ear effusion. No foreign body. Tympanic membrane is not erythematous or bulging.     Nose: Nose normal. No rhinorrhea.     Right Sinus: No maxillary sinus tenderness or frontal sinus tenderness.     Left Sinus: No maxillary sinus tenderness or frontal sinus tenderness.     Mouth/Throat:     Pharynx: Uvula midline. Posterior oropharyngeal erythema present. No oropharyngeal exudate.     Tonsils: No tonsillar abscesses.     Comments: Tenderness over right preauricular area proximal to parotid gland.  Area is not fluctuant.   She is missing lower molars. No tooth sensitivity with palpation or tapping with tongue depressor on exam upper or lower teeth. Tonsils well behind pillars. No purulent discharge draining in mouth or proximal to right sided pain.  Eyes:     Conjunctiva/sclera: Conjunctivae normal.  Cardiovascular:     Rate and Rhythm: Regular rhythm.     Pulses: Normal pulses.     Heart sounds: Normal heart sounds.  Pulmonary:     Effort: Pulmonary effort is normal.     Breath sounds: Normal breath sounds. No wheezing, rhonchi or rales.  Lymphadenopathy:     Head:     Right side of head: No submental, submandibular, tonsillar, preauricular, posterior auricular or occipital adenopathy.     Left side of head: No submental, submandibular, tonsillar, preauricular, posterior auricular or occipital adenopathy.     Cervical: No cervical adenopathy.  Skin:    General: Skin is warm and dry.  Neurological:     Mental Status: She is alert.     Cranial Nerves: No dysarthria.     Sensory: Sensation is intact.     Motor: No weakness.     Comments: Sensation intact across face.   Psychiatric:        Speech: Speech normal.         Behavior: Behavior normal.        Thought Content: Thought content normal.

## 2022-08-11 NOTE — Assessment & Plan Note (Addendum)
Presentation consistent with parotitis based on focal area of pain right preauricular area which was reproduced on exam.  Fortunately no systemic or neurologic features. Pain is not bilateral which is more likely in viral culprit such as mumps.  Start to cover for infectitous parotidis.  Alternatively differentials include sinusitis,  sialolithiasis ( from sinus medication?) . Less likely dental abscess due to absence of dental pain. Less likely TMJ due to poor response from tylenol, ibuprofen.

## 2022-08-19 ENCOUNTER — Ambulatory Visit (INDEPENDENT_AMBULATORY_CARE_PROVIDER_SITE_OTHER): Payer: PRIVATE HEALTH INSURANCE | Admitting: Internal Medicine

## 2022-08-19 ENCOUNTER — Encounter: Payer: Self-pay | Admitting: Internal Medicine

## 2022-08-19 VITALS — BP 128/80 | HR 90 | Temp 98.0°F | Resp 16 | Ht 59.0 in | Wt 313.2 lb

## 2022-08-19 DIAGNOSIS — D509 Iron deficiency anemia, unspecified: Secondary | ICD-10-CM

## 2022-08-19 DIAGNOSIS — D649 Anemia, unspecified: Secondary | ICD-10-CM

## 2022-08-19 DIAGNOSIS — G5601 Carpal tunnel syndrome, right upper limb: Secondary | ICD-10-CM

## 2022-08-19 DIAGNOSIS — R519 Headache, unspecified: Secondary | ICD-10-CM

## 2022-08-19 DIAGNOSIS — N644 Mastodynia: Secondary | ICD-10-CM

## 2022-08-19 DIAGNOSIS — Z1211 Encounter for screening for malignant neoplasm of colon: Secondary | ICD-10-CM

## 2022-08-19 DIAGNOSIS — C50912 Malignant neoplasm of unspecified site of left female breast: Secondary | ICD-10-CM

## 2022-08-19 DIAGNOSIS — F419 Anxiety disorder, unspecified: Secondary | ICD-10-CM | POA: Diagnosis not present

## 2022-08-19 DIAGNOSIS — K219 Gastro-esophageal reflux disease without esophagitis: Secondary | ICD-10-CM

## 2022-08-19 LAB — CBC WITH DIFFERENTIAL/PLATELET
Basophils Absolute: 0.1 10*3/uL (ref 0.0–0.1)
Basophils Relative: 1.4 % (ref 0.0–3.0)
Eosinophils Absolute: 0.3 10*3/uL (ref 0.0–0.7)
Eosinophils Relative: 4.9 % (ref 0.0–5.0)
HCT: 35 % — ABNORMAL LOW (ref 36.0–46.0)
Hemoglobin: 11 g/dL — ABNORMAL LOW (ref 12.0–15.0)
Lymphocytes Relative: 39.8 % (ref 12.0–46.0)
Lymphs Abs: 2.1 10*3/uL (ref 0.7–4.0)
MCHC: 31.5 g/dL (ref 30.0–36.0)
MCV: 80.9 fl (ref 78.0–100.0)
Monocytes Absolute: 0.4 10*3/uL (ref 0.1–1.0)
Monocytes Relative: 7.6 % (ref 3.0–12.0)
Neutro Abs: 2.5 10*3/uL (ref 1.4–7.7)
Neutrophils Relative %: 46.3 % (ref 43.0–77.0)
Platelets: 309 10*3/uL (ref 150.0–400.0)
RBC: 4.32 Mil/uL (ref 3.87–5.11)
RDW: 15.2 % (ref 11.5–15.5)
WBC: 5.3 10*3/uL (ref 4.0–10.5)

## 2022-08-19 MED ORDER — ESCITALOPRAM OXALATE 20 MG PO TABS: 20.0000 mg | ORAL_TABLET | Freq: Every day | ORAL | 1 refills | Status: DC

## 2022-08-19 NOTE — Progress Notes (Signed)
Subjective:    Patient ID: Brittney Nash, female    DOB: 21-Dec-1975, 47 y.o.   MRN: [id number]  Patient here for  Chief Complaint  Patient presents with   Medical Management of Chronic Issues    HPI Here for f/u. Recent left breast pain.  States the left breast pain has resolved.  Has not followed up with scheduling oncology appt.  They have been trying to contact her.  Discussed with her today regarding the importance of f/u and f/u mammogram.  Needs to schedule.  Plans to call them back today.  Also, evaluated 08/08/22 - diagnosed with probable TMJ. Treated with robaxin and flonase. Persistent issues. Evaluated 08/11/22 - parotiditis.  Treated with augmentin.  Ultrasound - Multiple prominent lymph nodes in the region of tenderness. The largest measures up to 2.2 cm and demonstrates cortical thickening up to 7 mm.  Discussed CT results.  Pain is better today.  Discussed f/u CT scan.  Described acid reflux.  Discussed taking prilosec.  Carpal tunnel worse.  Discussed wearing splints.    Past Medical History:  Diagnosis Date   Arthritis 2013   Breast cancer ([place]) 08/26/2016   History of femur fracture 2004   s/p surgery with rod placement   Past Surgical History:  Procedure Laterality Date   BREAST LUMPECTOMY Left 11/02/2016   LEG SURGERY Left    Family History  Problem Relation Age of Onset   Arthritis Mother    Heart disease Paternal Grandfather    Social History   Socioeconomic History   Marital status: Single    Spouse name: Not on file   Number of children: Not on file   Years of education: Not on file   Highest education level: Not on file  Occupational History   Not on file  Tobacco Use   Smoking status: Never   Smokeless tobacco: Never  Substance and Sexual Activity   Alcohol use: Yes    Comment: occ 1-2 times a month    Drug use: No   Sexual activity: Not on file  Other Topics Concern   Not on file  Social History Narrative   Not on file   Social  Determinants of Health   Financial Resource Strain: Not on file  Food Insecurity: Not on file  Transportation Needs: Not on file  Physical Activity: Not on file  Stress: Not on file  Social Connections: Not on file     Review of Systems  Constitutional:  Negative for appetite change and fever.  HENT:  Negative for congestion and sinus pressure.        Head/neck pain as outlined.  No actual headache.    Respiratory:  Negative for cough, chest tightness and shortness of breath.   Cardiovascular:  Negative for chest pain and palpitations.  Gastrointestinal:  Negative for abdominal pain, diarrhea, nausea and vomiting.  Genitourinary:  Negative for difficulty urinating and dysuria.  Musculoskeletal:  Negative for joint swelling and myalgias.  Neurological:  Negative for dizziness and headaches.  Psychiatric/Behavioral:  Negative for agitation and dysphoric mood.        Objective:     BP 128/80   Pulse 90   Temp 98 F (36.7 C)   Resp 16   Ht '4\' 11"'$  (1.499 m)   Wt (!) 313 lb 3.2 oz (142.1 kg)   LMP  (LMP Unknown)   SpO2 98%   BMI 63.26 kg/m  Wt Readings from Last 3 Encounters:  08/19/22 (!) 313 lb  3.2 oz (142.1 kg)  08/11/22 (!) 314 lb (142.4 kg)  08/04/22 (!) 311 lb (141.1 kg)    Physical Exam Vitals reviewed.  Constitutional:      General: She is not in acute distress.    Appearance: Normal appearance.  HENT:     Head: Normocephalic and atraumatic.     Right Ear: Tympanic membrane, ear canal and external ear normal.     Left Ear: Tympanic membrane, ear canal and external ear normal.     Mouth/Throat:     Pharynx: No oropharyngeal exudate or posterior oropharyngeal erythema.  Eyes:     General: No scleral icterus.       Right eye: No discharge.        Left eye: No discharge.     Conjunctiva/sclera: Conjunctivae normal.  Neck:     Thyroid: No thyromegaly.  Cardiovascular:     Rate and Rhythm: Normal rate and regular rhythm.  Pulmonary:     Effort: No  respiratory distress.     Breath sounds: Normal breath sounds. No wheezing.  Abdominal:     General: Bowel sounds are normal.     Palpations: Abdomen is soft.     Tenderness: There is no abdominal tenderness.  Musculoskeletal:        General: No swelling or tenderness.     Cervical back: Neck supple. No tenderness.  Lymphadenopathy:     Cervical: No cervical adenopathy.  Skin:    Findings: No erythema or rash.  Neurological:     Mental Status: She is alert.  Psychiatric:        Mood and Affect: Mood normal.        Behavior: Behavior normal.      Outpatient Encounter Medications as of 08/19/2022  Medication Sig   [DISCONTINUED] methocarbamol (ROBAXIN) 500 MG tablet Take by mouth.   [EXPIRED] amoxicillin-clavulanate (AUGMENTIN) 875-125 MG tablet Take 1 tablet by mouth 2 (two) times daily for 7 days.   cyanocobalamin 1000 MCG tablet Take 1,000 mcg by mouth daily.   cyclobenzaprine (FLEXERIL) 5 MG tablet Take 1 tablet (5 mg total) by mouth at bedtime as needed for muscle spasms.   escitalopram (LEXAPRO) 20 MG tablet Take 1 tablet (20 mg total) by mouth daily.   ferrous sulfate 325 (65 FE) MG tablet Take 325 mg by mouth daily with breakfast. (Patient not taking: Reported on 08/19/2022)   [DISCONTINUED] escitalopram (LEXAPRO) 20 MG tablet Take 1 tablet by mouth once daily   No facility-administered encounter medications on file as of 08/19/2022.     Lab Results  Component Value Date   WBC 5.3 08/19/2022   HGB 11.0 (L) 08/19/2022   HCT 35.0 (L) 08/19/2022   PLT 309.0 08/19/2022   GLUCOSE 83 08/04/2022   CHOL 156 08/04/2022   TRIG 56.0 08/04/2022   HDL 63.40 08/04/2022   LDLCALC 82 08/04/2022   ALT 13 08/04/2022   AST 14 08/04/2022   NA 141 08/04/2022   K 4.3 08/04/2022   CL 103 08/04/2022   CREATININE 0.59 08/04/2022   BUN 12 08/04/2022   CO2 28 08/04/2022   TSH 1.73 08/04/2022   INR 1.1 09/02/2011    US Soft Tissue Head/Neck (NON-THYROID)  Result Date:  08/11/2022 CLINICAL DATA:  Right parotid tenderness EXAM: ULTRASOUND OF HEAD/NECK SOFT TISSUES TECHNIQUE: Ultrasound examination of the head and neck soft tissues was performed in the area of clinical concern. COMPARISON:  None Available. FINDINGS: Targeted sonography over the right jaw is performed in the  region of tenderness. No focal fluid collection to suggest abscess. Several prominent lymph nodes are visualized in the region of tenderness, the largest measures 2.2 by 0.8 by 1.2 cm. Cortex is thickened up to 7 mm. IMPRESSION: 1. Negative for focal fluid collection at the right jaw. 2. Multiple prominent lymph nodes in the region of tenderness. The largest measures up to 2.2 cm and demonstrates cortical thickening up to 7 mm; lymph node could be inflammatory, infection, or neoplastic in etiology. Correlation with contrast enhanced neck CT versus tissue sampling versus short interval follow-up could be considered for further assessment depending on the level of clinical concern Electronically Signed   By: Donavan Foil M.D.   On: 08/11/2022 15:58       Assessment & Plan:  Anemia, unspecified type Assessment & Plan: Follow cbc and iron studies.  Discussed taking iron and B12.   Orders: -     CBC with Differential/Platelet  Iron deficiency anemia, unspecified iron deficiency anemia type Assessment & Plan: Follow cbc and iron studies.  Discussed taking iron and B12.   Orders: -     Ambulatory referral to Gastroenterology  Anxiety Assessment & Plan: Continue lexapro.  Overall appears to be stable.  Follow.    Malignant neoplasm of left female breast, unspecified estrogen receptor status, unspecified site of breast [facility]) Assessment & Plan: Was followed by oncology. Is overdue f/u.  Was on tamoxifen. Not taking now.  She stopped.  Needs to restart. She reports was seeing Dr Marolyn Hammock.  Is no longer practicing.  Oncology trying to reach her to schedule. She has not scheduled. Also trying to  schedule diagnostic mammogram and ultrasound.  Discussed importance of f/u with her today.  She plans to call today to schedule.     Breast pain, left Assessment & Plan: Pain resolved.     Colon cancer screening Assessment & Plan: Due colonoscopy.    Carpal tunnel syndrome of right wrist Assessment & Plan: Discussed splints.  Notify if desires further intervention.    Gastroesophageal reflux disease, unspecified whether esophagitis present Assessment & Plan: PPI daily.  Follow.    Right sided facial pain Assessment & Plan: Was initially evaluated UC.  Diagnosed with TMJ.  Prescribed muscle relaxer and flonase.  Persistent issue and saw Mable Paris.  Diagnosed with parotiditis.  Prescribed augmentin. CT as outlined.  Currently pain improved.  Discussed f/u CT especially given history.  Agreeable to CT scan.  Follow.  Unclear etiology, but improved currently.  No significant pain today.    Other orders -     Escitalopram Oxalate; Take 1 tablet (20 mg total) by mouth daily.  Dispense: 30 tablet; Refill: 1     Einar Pheasant, MD

## 2022-08-20 ENCOUNTER — Encounter: Payer: Self-pay | Admitting: Internal Medicine

## 2022-08-22 ENCOUNTER — Encounter: Payer: Self-pay | Admitting: Internal Medicine

## 2022-08-22 DIAGNOSIS — R519 Headache, unspecified: Secondary | ICD-10-CM | POA: Insufficient documentation

## 2022-08-22 NOTE — Assessment & Plan Note (Signed)
Due colonoscopy.

## 2022-08-22 NOTE — Assessment & Plan Note (Signed)
Follow cbc and iron studies.  Discussed taking iron and B12.

## 2022-08-22 NOTE — Assessment & Plan Note (Signed)
Was followed by oncology. Is overdue f/u.  Was on tamoxifen. Not taking now.  She stopped.  Needs to restart. She reports was seeing Dr Marolyn Hammock.  Is no longer practicing.  Oncology trying to reach her to schedule. She has not scheduled. Also trying to schedule diagnostic mammogram and ultrasound.  Discussed importance of f/u with her today.  She plans to call today to schedule.

## 2022-08-22 NOTE — Assessment & Plan Note (Signed)
Continue lexapro.  Overall appears to be stable.  Follow.

## 2022-08-22 NOTE — Telephone Encounter (Signed)
Please inform her that I was out of the office Friday.  When I last saw her, her pain had improved.  Please call and see how she is doing.  Also, given the persistent increased pain and her history, I had discussed with her regarding a f/u CT neck - given abnormal ultrasound.  See if agreeable to schedule.  Also will need to confirm no allergy to contrast dye or shellfish or iodine.  Also confirm not pregnant.  If she is continuing to have increased pain - needs to be evaluated.

## 2022-08-22 NOTE — Assessment & Plan Note (Signed)
Discussed splints.  Notify if desires further intervention.

## 2022-08-22 NOTE — Assessment & Plan Note (Signed)
PPI daily.  Follow.

## 2022-08-22 NOTE — Assessment & Plan Note (Signed)
Pain resolved.

## 2022-08-22 NOTE — Assessment & Plan Note (Addendum)
Was initially evaluated UC.  Diagnosed with TMJ.  Prescribed muscle relaxer and flonase.  Persistent issue and saw Mable Paris.  Diagnosed with parotiditis.  Prescribed augmentin. CT as outlined.  Currently pain improved.  Discussed f/u CT especially given history.  Agreeable to CT scan.  Follow.  Unclear etiology, but improved currently.  No significant pain today.

## 2022-08-25 NOTE — Telephone Encounter (Signed)
Duplicate.  See previous message about refill for flexeril and f/u with dentist

## 2022-08-25 NOTE — Telephone Encounter (Signed)
If taking an tolerating, ok to refill flexeril '5mg'$  q hs prn (#15 with no refills).  Also, did she get an appt with her dentist.  Keep [place] posted on how she is doing.

## 2022-09-30 ENCOUNTER — Ambulatory Visit: Payer: PRIVATE HEALTH INSURANCE | Admitting: Internal Medicine

## 2022-10-11 ENCOUNTER — Encounter: Payer: Self-pay | Admitting: Internal Medicine

## 2022-10-11 ENCOUNTER — Ambulatory Visit (INDEPENDENT_AMBULATORY_CARE_PROVIDER_SITE_OTHER): Payer: PRIVATE HEALTH INSURANCE | Admitting: Internal Medicine

## 2022-10-11 VITALS — BP 118/74 | HR 98 | Temp 98.0°F | Resp 16 | Ht 59.0 in | Wt 304.0 lb

## 2022-10-11 DIAGNOSIS — F419 Anxiety disorder, unspecified: Secondary | ICD-10-CM | POA: Diagnosis not present

## 2022-10-11 DIAGNOSIS — R9389 Abnormal findings on diagnostic imaging of other specified body structures: Secondary | ICD-10-CM

## 2022-10-11 DIAGNOSIS — R519 Headache, unspecified: Secondary | ICD-10-CM

## 2022-10-11 DIAGNOSIS — N949 Unspecified condition associated with female genital organs and menstrual cycle: Secondary | ICD-10-CM | POA: Diagnosis not present

## 2022-10-11 DIAGNOSIS — C50912 Malignant neoplasm of unspecified site of left female breast: Secondary | ICD-10-CM | POA: Diagnosis not present

## 2022-10-11 DIAGNOSIS — D649 Anemia, unspecified: Secondary | ICD-10-CM | POA: Diagnosis not present

## 2022-10-11 DIAGNOSIS — K219 Gastro-esophageal reflux disease without esophagitis: Secondary | ICD-10-CM

## 2022-10-11 DIAGNOSIS — Z1211 Encounter for screening for malignant neoplasm of colon: Secondary | ICD-10-CM

## 2022-10-11 DIAGNOSIS — F439 Reaction to severe stress, unspecified: Secondary | ICD-10-CM

## 2022-10-11 MED ORDER — AMOXICILLIN-POT CLAVULANATE 875-125 MG PO TABS: 1.0000 | ORAL_TABLET | Freq: Two times a day (BID) | ORAL | 0 refills | Status: DC

## 2022-10-11 MED ORDER — ESCITALOPRAM OXALATE 20 MG PO TABS: 20.0000 mg | ORAL_TABLET | Freq: Every day | ORAL | 1 refills | Status: DC

## 2022-10-11 NOTE — Progress Notes (Signed)
Subjective:    Patient ID: Brittney Nash, female    DOB: 01/04/1976, 47 y.o.   MRN: [id number]  Patient here for  Chief Complaint  Patient presents with   Medical Management of Chronic Issues    HPI Here to follow up regarding anemia and history of breast cancer.  Evaluated 08/08/22 - diagnosed with probable TMJ. Treated with robaxin and flonase. Persistent issues. Evaluated 08/11/22 - parotiditis.  Treated with augmentin.  Ultrasound - Multiple prominent lymph nodes in the region of tenderness. The largest measures up to 2.2 cm and demonstrates cortical thickening up to 7 mm.  Discussed CT results.  Pain is better today.  Discussed f/u CT scan. She still has not had.  Also have discussed with her regarding the need for f/u regarding her breast cancer.  States her oncologist no longer takes her insurance.  Overdue mammogram and f/u.  She is off tamoxifen.  Reports previous pain/inflammation (parotiditis/TMJ?) is better.  She is having problems with a tooth - left.  Is planning to f/u with dentist, but request something to have until can be seen.  No chest pain.  Breathing stable.  No abdominal pain.  Not taking iron as previously instructed.    Past Medical History:  Diagnosis Date   Arthritis 2013   Breast cancer 08/26/2016   History of femur fracture 2004   s/p surgery with rod placement   Past Surgical History:  Procedure Laterality Date   BREAST LUMPECTOMY Left 11/02/2016   LEG SURGERY Left    Family History  Problem Relation Age of Onset   Arthritis Mother    Heart disease Paternal Grandfather    Social History   Socioeconomic History   Marital status: Single    Spouse name: Not on file   Number of children: Not on file   Years of education: Not on file   Highest education level: Not on file  Occupational History   Not on file  Tobacco Use   Smoking status: Never   Smokeless tobacco: Never  Substance and Sexual Activity   Alcohol use: Yes    Comment: occ 1-2 times a  month    Drug use: No   Sexual activity: Not on file  Other Topics Concern   Not on file  Social History Narrative   Not on file   Social Determinants of Health   Financial Resource Strain: Not on file  Food Insecurity: Not on file  Transportation Needs: Not on file  Physical Activity: Not on file  Stress: Not on file  Social Connections: Not on file     Review of Systems  Constitutional:  Negative for appetite change and unexpected weight change.  HENT:  Negative for congestion and sinus pressure.   Respiratory:  Negative for cough, chest tightness and shortness of breath.   Cardiovascular:  Negative for chest pain and palpitations.  Gastrointestinal:  Negative for abdominal pain, diarrhea, nausea and vomiting.  Genitourinary:  Negative for difficulty urinating and dysuria.  Musculoskeletal:  Negative for joint swelling and myalgias.  Skin:  Negative for color change and rash.  Neurological:  Negative for dizziness and headaches.  Psychiatric/Behavioral:  Negative for agitation and dysphoric mood.        Objective:     BP 118/74   Pulse 98   Temp 98 F (36.7 C)   Resp 16   Ht  (1.499 m)   Wt (!) 304 lb (137.9 kg)   SpO2 99%   BMI  61.40 kg/m  Wt Readings from Last 3 Encounters:  10/11/22 (!) 304 lb (137.9 kg)  08/19/22 (!) 313 lb 3.2 oz (142.1 kg)  08/11/22 (!) 314 lb (142.4 kg)    Physical Exam Vitals reviewed.  Constitutional:      General: She is not in acute distress.    Appearance: Normal appearance.  HENT:     Head: Normocephalic and atraumatic.     Right Ear: External ear normal.     Left Ear: External ear normal.  Eyes:     General: No scleral icterus.       Right eye: No discharge.        Left eye: No discharge.     Conjunctiva/sclera: Conjunctivae normal.  Neck:     Thyroid: No thyromegaly.  Cardiovascular:     Rate and Rhythm: Normal rate and regular rhythm.  Pulmonary:     Effort: No respiratory distress.     Breath sounds:  Normal breath sounds. No wheezing.  Abdominal:     General: Bowel sounds are normal.     Palpations: Abdomen is soft.     Tenderness: There is no abdominal tenderness.  Musculoskeletal:        General: No swelling or tenderness.     Cervical back: Neck supple. No tenderness.  Lymphadenopathy:     Cervical: No cervical adenopathy.  Skin:    Findings: No erythema or rash.  Neurological:     Mental Status: She is alert.  Psychiatric:        Mood and Affect: Mood normal.        Behavior: Behavior normal.      Outpatient Encounter Medications as of 10/11/2022  Medication Sig   amoxicillin-clavulanate (AUGMENTIN) 875-125 MG tablet Take 1 tablet by mouth 2 (two) times daily.   cyclobenzaprine (FLEXERIL) 5 MG tablet Take 1 tablet (5 mg total) by mouth at bedtime as needed for muscle spasms.   [DISCONTINUED] escitalopram (LEXAPRO) 20 MG tablet Take 1 tablet (20 mg total) by mouth daily.   cyanocobalamin 1000 MCG tablet Take 1,000 mcg by mouth daily. (Patient not taking: Reported on 10/11/2022)   escitalopram (LEXAPRO) 20 MG tablet Take 1 tablet (20 mg total) by mouth daily.   ferrous sulfate 325 (65 FE) MG tablet Take 325 mg by mouth daily with breakfast. (Patient not taking: Reported on 08/19/2022)   No facility-administered encounter medications on file as of 10/11/2022.     Lab Results  Component Value Date   WBC 7.0 10/11/2022   HGB 11.5 (L) 10/11/2022   HCT 35.3 (L) 10/11/2022   PLT 338.0 10/11/2022   GLUCOSE 83 08/04/2022   CHOL 156 08/04/2022   TRIG 56.0 08/04/2022   HDL 63.40 08/04/2022   LDLCALC 82 08/04/2022   ALT 13 08/04/2022   AST 14 08/04/2022   NA 141 08/04/2022   K 4.3 08/04/2022   CL 103 08/04/2022   CREATININE 0.59 08/04/2022   BUN 12 08/04/2022   CO2 28 08/04/2022   TSH 1.73 08/04/2022   INR 1.1 09/02/2011    US Soft Tissue Head/Neck (NON-THYROID)  Result Date: 08/11/2022 CLINICAL DATA:  Right parotid tenderness EXAM: ULTRASOUND OF HEAD/NECK SOFT  TISSUES TECHNIQUE: Ultrasound examination of the head and neck soft tissues was performed in the area of clinical concern. COMPARISON:  None Available. FINDINGS: Targeted sonography over the right jaw is performed in the region of tenderness. No focal fluid collection to suggest abscess. Several prominent lymph nodes are visualized in the region of  tenderness, the largest measures 2.2 by 0.8 by 1.2 cm. Cortex is thickened up to 7 mm. IMPRESSION: 1. Negative for focal fluid collection at the right jaw. 2. Multiple prominent lymph nodes in the region of tenderness. The largest measures up to 2.2 cm and demonstrates cortical thickening up to 7 mm; lymph node could be inflammatory, infection, or neoplastic in etiology. Correlation with contrast enhanced neck CT versus tissue sampling versus short interval follow-up could be considered for further assessment depending on the level of clinical concern Electronically Signed   By: Jasmine Pang M.D.   On: 08/11/2022 15:58       Assessment & Plan:  Anemia, unspecified type Assessment & Plan: Not taking iron.  Check cbc and iron studies today.  Also check B12.   Orders: -     CBC with Differential/Platelet -     IBC + Ferritin -     Vitamin B12  Adnexal cyst Assessment & Plan: Had noted left adnexal cyst on previous MRI 07/2018.  Saw gyn.  Had f/u pelvic ultrasound 08/2018.  Continued to f/u with gyn throughout her pregnancy in 2020.  She is planning to schedule a f/u with gyn - Dr Feliberto Gottron. Needs f/u scheduled.   Anxiety Assessment & Plan: Continue lexapro.  Overall appears to be stable.  Follow.    Malignant neoplasm of left female breast, unspecified estrogen receptor status, unspecified site of breast Assessment & Plan: Was followed by oncology. Is overdue f/u.  Was on tamoxifen. Not taking now.  She stopped.  Needs to restart. She reports was seeing Dr Archie Balboa.  Is no longer practicing.  Per previous review, oncology trying to reach her to  schedule. She has not scheduled. On discussion today, she reports her oncology group is not covered by her insurance. Also trying to schedule diagnostic mammogram and ultrasound.  Discussed importance of f/u with her today.  She is agreeable for me to schedule the mammogram and schedule oncology here in town.    Orders: -     MM 3D DIAGNOSTIC MAMMOGRAM BILATERAL BREAST; Future -     [place] LIMITED ULTRASOUND INCLUDING AXILLA RIGHT BREAST; Future -     [place] LIMITED ULTRASOUND INCLUDING AXILLA LEFT BREAST ; Future -     Ambulatory referral to Hematology / Oncology  Colon cancer screening Assessment & Plan: Due colonoscopy.  Discussed.     Gastroesophageal reflux disease, unspecified whether esophagitis present Assessment & Plan: PPI daily.  Follow.    Right sided facial pain Assessment & Plan: Was initially evaluated UC.  Diagnosed with TMJ.  Prescribed muscle relaxer and flonase.  Persistent issue and saw Rennie Plowman.  Diagnosed with parotiditis.  Prescribed augmentin. CT as outlined.  Currently pain improved.  Discussed f/u CT especially given history.  Agreeable to CT scan.  Follow.  Unclear etiology, but improved currently.  No significant pain today. Agreeable for me to schedule CT.  Treat tooth (left) with augmentin.  Instructed needs f/u with dentist.     Stress Assessment & Plan: Appears to be doing well on lexapro.  Follow.    Abnormal ultrasound of neck -     CT SOFT TISSUE NECK W CONTRAST; Future  Other orders -     Escitalopram Oxalate; Take 1 tablet (20 mg total) by mouth daily.  Dispense: 30 tablet; Refill: 1 -     Amoxicillin-Pot Clavulanate; Take 1 tablet by mouth 2 (two) times daily.  Dispense: 14 tablet; Refill: 0     Dale [place],  MD

## 2022-10-12 LAB — CBC WITH DIFFERENTIAL/PLATELET
Basophils Absolute: 0.1 10*3/uL (ref 0.0–0.1)
Basophils Relative: 1.3 % (ref 0.0–3.0)
Eosinophils Absolute: 0.2 10*3/uL (ref 0.0–0.7)
Eosinophils Relative: 3.1 % (ref 0.0–5.0)
HCT: 35.3 % — ABNORMAL LOW (ref 36.0–46.0)
Hemoglobin: 11.5 g/dL — ABNORMAL LOW (ref 12.0–15.0)
Lymphocytes Relative: 33.9 % (ref 12.0–46.0)
Lymphs Abs: 2.4 10*3/uL (ref 0.7–4.0)
MCHC: 32.4 g/dL (ref 30.0–36.0)
MCV: 78.9 fl (ref 78.0–100.0)
Monocytes Absolute: 0.4 10*3/uL (ref 0.1–1.0)
Monocytes Relative: 6.1 % (ref 3.0–12.0)
Neutro Abs: 3.9 10*3/uL (ref 1.4–7.7)
Neutrophils Relative %: 55.6 % (ref 43.0–77.0)
Platelets: 338 10*3/uL (ref 150.0–400.0)
RBC: 4.48 Mil/uL (ref 3.87–5.11)
RDW: 15.1 % (ref 11.5–15.5)
WBC: 7 10*3/uL (ref 4.0–10.5)

## 2022-10-12 LAB — IBC + FERRITIN
Ferritin: 8.1 ng/mL — ABNORMAL LOW (ref 10.0–291.0)
Iron: 39 ug/dL — ABNORMAL LOW (ref 42–145)
Saturation Ratios: 7.5 % — ABNORMAL LOW (ref 20.0–50.0)
TIBC: 516.6 ug/dL — ABNORMAL HIGH (ref 250.0–450.0)
Transferrin: 369 mg/dL — ABNORMAL HIGH (ref 212.0–360.0)

## 2022-10-12 LAB — VITAMIN B12: Vitamin B-12: 588 pg/mL (ref 211–911)

## 2022-10-15 ENCOUNTER — Telehealth: Payer: Self-pay | Admitting: *Deleted

## 2022-10-15 NOTE — Telephone Encounter (Signed)
Vertis Kelch, [facility] 10/15/2022 12:44 PM EDT Back to Top    Called pt but vm not set up to leave message to call office back.   Dale , MD 10/14/2022  5:32 AM EDT     Notify - hgb is stable.  Still slightly decreased, but stable.  Iron stores - low.  Had previously been instructed to take iron.  At office visit, she was not taking.  Needs to start ferrous sulfate  q day.  Also had referred her to GI for further evaluation - iron deficient anemia (and due colonoscopy).  Need to confirm if appt has been made.  If not, needs appt.  Order for referral is already in epic.  B12 level ok.  Continue.

## 2022-10-17 ENCOUNTER — Encounter: Payer: Self-pay | Admitting: Internal Medicine

## 2022-10-17 NOTE — Assessment & Plan Note (Signed)
PPI daily.  Follow.  

## 2022-10-17 NOTE — Assessment & Plan Note (Signed)
Had noted left adnexal cyst on previous MRI 07/2018.  Saw gyn.  Had f/u pelvic ultrasound 08/2018.  Continued to f/u with gyn throughout her pregnancy in 2020.  She is planning to schedule a f/u with gyn - Dr Schermerhorn. Needs f/u scheduled. 

## 2022-10-17 NOTE — Assessment & Plan Note (Signed)
Continue lexapro.  Overall appears to be stable.  Follow.  

## 2022-10-17 NOTE — Assessment & Plan Note (Signed)
Was followed by oncology. Is overdue f/u.  Was on tamoxifen. Not taking now.  She stopped.  Needs to restart. She reports was seeing Dr Archie Balboa.  Is no longer practicing.  Per previous review, oncology trying to reach her to schedule. She has not scheduled. On discussion today, she reports her oncology group is not covered by her insurance. Also trying to schedule diagnostic mammogram and ultrasound.  Discussed importance of f/u with her today.  She is agreeable for me to schedule the mammogram and schedule oncology here in town.

## 2022-10-17 NOTE — Assessment & Plan Note (Signed)
Due colonoscopy.  Discussed.

## 2022-10-17 NOTE — Assessment & Plan Note (Signed)
Was initially evaluated UC.  Diagnosed with TMJ.  Prescribed muscle relaxer and flonase.  Persistent issue and saw Rennie Plowman.  Diagnosed with parotiditis.  Prescribed augmentin. CT as outlined.  Currently pain improved.  Discussed f/u CT especially given history.  Agreeable to CT scan.  Follow.  Unclear etiology, but improved currently.  No significant pain today. Agreeable for me to schedule CT.  Treat tooth (left) with augmentin.  Instructed needs f/u with dentist.

## 2022-10-17 NOTE — Assessment & Plan Note (Signed)
Appears to be doing well on lexapro.  Follow.  ?

## 2022-10-17 NOTE — Assessment & Plan Note (Signed)
Not taking iron.  Check cbc and iron studies today.  Also check B12.

## 2022-10-21 ENCOUNTER — Telehealth: Payer: Self-pay

## 2022-10-21 NOTE — Telephone Encounter (Signed)
Courtesy call made to patient of new patient, no answer. Unable to leave voicemail.

## 2022-10-22 ENCOUNTER — Ambulatory Visit
Admission: RE | Admit: 2022-10-22 | Discharge: 2022-10-22 | Disposition: A | Discharge status: Home / Self Care | Payer: Self-pay | Source: Ambulatory Visit | Attending: Internal Medicine | Admitting: Internal Medicine

## 2022-10-22 ENCOUNTER — Inpatient Hospital Stay: Payer: PRIVATE HEALTH INSURANCE | Attending: Internal Medicine | Admitting: Internal Medicine

## 2022-10-22 ENCOUNTER — Encounter: Payer: Self-pay | Admitting: Internal Medicine

## 2022-10-22 ENCOUNTER — Other Ambulatory Visit: Payer: Self-pay | Admitting: *Deleted

## 2022-10-22 ENCOUNTER — Inpatient Hospital Stay: Payer: PRIVATE HEALTH INSURANCE

## 2022-10-22 ENCOUNTER — Encounter: Payer: Self-pay | Admitting: *Deleted

## 2022-10-22 ENCOUNTER — Other Ambulatory Visit: Payer: Self-pay | Admitting: Internal Medicine

## 2022-10-22 VITALS — BP 123/82 | HR 91 | Temp 96.9°F | Resp 20 | Wt 312.5 lb

## 2022-10-22 DIAGNOSIS — Z923 Personal history of irradiation: Secondary | ICD-10-CM | POA: Diagnosis not present

## 2022-10-22 DIAGNOSIS — Z1231 Encounter for screening mammogram for malignant neoplasm of breast: Secondary | ICD-10-CM

## 2022-10-22 DIAGNOSIS — R590 Localized enlarged lymph nodes: Secondary | ICD-10-CM | POA: Diagnosis not present

## 2022-10-22 DIAGNOSIS — Z853 Personal history of malignant neoplasm of breast: Secondary | ICD-10-CM | POA: Diagnosis not present

## 2022-10-22 DIAGNOSIS — C50912 Malignant neoplasm of unspecified site of left female breast: Secondary | ICD-10-CM

## 2022-10-22 DIAGNOSIS — D509 Iron deficiency anemia, unspecified: Secondary | ICD-10-CM | POA: Diagnosis not present

## 2022-10-22 DIAGNOSIS — D508 Other iron deficiency anemias: Secondary | ICD-10-CM

## 2022-10-22 NOTE — Progress Notes (Signed)
Patient has no concerns 

## 2022-10-22 NOTE — Progress Notes (Signed)
[name] CONSULT NOTE  Patient Care Team: Dale Elberfeld, MD as PCP - General (Internal Medicine)  REFERRING PROVIDER: Self referred  REASON FOR REFFERAL: History of left breast IDC Dx March 2018.   CANCER STAGING   Cancer Staging  Breast cancer (HCC) Staging form: Breast, AJCC 8th Edition - Pathologic: Stage IA (pT1c, pN0(sn), cM0, G1, ER+, PR+, HER2-, Oncotype DX score: 15) - Signed by Michaelyn Barter, MD on 10/22/2022 Stage prefix: Initial diagnosis Method of lymph node assessment: Sentinel lymph node biopsy Multigene prognostic tests performed: Oncotype DX Recurrence score range: Greater than or equal to 11 Histologic grading system: 3 grade system   ASSESSMENT & PLAN:  [name] 47 y.o. female with pmh of iron deficiency anemia and arthritis was referred to medical oncology for history of left breast cancer stage Ia diagnosed in March 2018.  # History of left breast IDC stage IA (pT1c N0, cM0) ER/PR positive, HER2 negative - self-detected stage IA (pT1c, pN0(sn), cM0), grade 1, ER 91-100%, PR 81-90%, HER 2 negative (0) IDC of the left breast status post left PM and SLNB with clear margins and 0/5 LN on 11/02/16 and adjuvant radiation 12/14/16-01/11/17. Her Oncotype DX recurrence score was 15 with a 10-year risk of disease recurrence with tam alone of 10%.  No benefit from chemotherapy.  -Completed whole breast RT from 12/14/2016 to 01/11/2017 total dose 527 2 cGy  -She completed 5 years of tamoxifen around March 2023.  Had all her care at [facility].  Was following with Dr. Archie Balboa.  Genetic testing was done with [facility] and was negative.  -Her last mammogram was sometime in 2021 or 22.  She is scheduled for mammogram in May 2024.  We discussed about the importance of staying up-to-date with her annual mammogram.  # Iron deficiency anemia -Labs reviewed from 10/11/2022.  Ferritin 8.  Saturation 7%.  Hemoglobin 11.5.  Vitamin B12 588.  Patient started ferrous sulfate 325  mg daily about a week ago.  Advised to take with a glass of orange juice or vitamin C. Patient reports she is already referred to GI by Dr. Lorin Picket her primary for consideration of endoscopy and colonoscopy to rule out any occult GI bleed.  Denies any heavy bleeding with menstrual cycles.  -Discussed with the patient about repeating CBC with differential and iron panel in 4 months to assess response to oral iron.  If iron level is still low at that time would recommend IV iron infusions.  # Cervical lymphadenopathy - US STN (08/11/2022) -done for right parotid pain.  Was treated with 7 days of antibiotics.  Showed multiple prominent lymph nodes in the region of tenderness largest measuring 2.2 x 0.8 x 1.2 cm cortex thickened up to 7 mm.  Could be inflammatory/infectious or neoplastic  - Scheduled for CT STN on 10/29/2022 to reassess lymphadenopathy.  Her symptoms have resolved.   Orders Placed This Encounter  Procedures   CBC with Differential/Platelet    Standing Status:   Future    Standing Expiration Date:   10/22/2023   Iron and TIBC    Standing Status:   Future    Standing Expiration Date:   10/22/2023   Ferritin    Standing Status:   Future    Standing Expiration Date:   10/22/2023    The total time spent in the appointment was 55 minutes encounter with patients including review of chart and various tests results, discussions about plan of care and coordination of care plan  All questions were answered. The patient knows to call the clinic with any problems, questions or concerns. No barriers to learning was detected.  Michaelyn Barter, MD 4/26/202412:58 PM   HISTORY OF PRESENTING ILLNESS:  Brittney Nash 47 y.o. female with pmh of with self-detected stage IA (pT1c, pN0(sn), cM0), grade 1, ER 91-100%, PR 81-90%, HER 2 negative (0) IDC of the left breast status post left PM and SLNB with clear margins and 0/5 LN on 11/02/16 and adjuvant radiation 12/14/16-01/11/17. Her Oncotype DX  recurrence score was 15 with a 10-year risk of disease recurrence with tam alone of 10%.   She reports feeling well overall.  She stopped her tamoxifen in March 2023.  She last saw her oncologist around 2021.  She has not had mammogram since 2021/22.  She is scheduled for 1 next month. Had an episode of right parotid tenderness and was treated with antibiotics.  Ultrasound done at that time showed cervical lymphadenopathy.  She is scheduled for CT neck next month. Also found to have low iron level.  Started iron pills about a week ago.  Tolerating well.  Denies any heavy vaginal bleeding.  Denies any bleeding in urine or stool.  Has been referred to GI by Dr. Lorin Picket for endoscopy and colonoscopy.  Oncology history- 09/09/2016 - Presenting Symptoms Palpable L breast mass. Dx MMG: L breast 1 cm irregular mass with associated calcs in central breast. An asymmetry in retroareolar R breast effaces.   Ultrasound: L breast 1 cm oval, circumscribed, hypoechoic mass in the central breast.  09/15/2016 Biopsy L breast USG core biopsy [facility]: IDC, G1, ER+(100%), PR+(90%), HER2-(0).  11/02/2016 Surgery L PM with clear margins and SLNB (0/5 LN); IDC with DCIS, ER 91-100%, PR 81-100%, Her2neu negative, invasive carcinoma 16mm in greatest dimension   11/19/2016 Genetics Oncotype recurrence score result 15 with 10 year risk of distant recurrence with Tam alone 10%.  12/14/2016 - 01/11/2017 Radiation Whole breast RT 267 cGy x 16 fx = 4272 cGy Boost 250 cGy x 4 fx = 1000 cGy Total dose: 5272 cGy   MEDICAL HISTORY:  Past Medical History:  Diagnosis Date   Arthritis 2013   Breast cancer (HCC) 08/26/2016   History of femur fracture 2004   s/p surgery with rod placement    SURGICAL HISTORY: Past Surgical History:  Procedure Laterality Date   BREAST LUMPECTOMY Left 11/02/2016   LEG SURGERY Left     SOCIAL HISTORY: Social History   Socioeconomic History   Marital status: Single    Spouse name: Not  on file   Number of children: Not on file   Years of education: Not on file   Highest education level: Not on file  Occupational History   Not on file  Tobacco Use   Smoking status: Never   Smokeless tobacco: Never  Substance and Sexual Activity   Alcohol use: Yes    Comment: occ 1-2 times a month    Drug use: No   Sexual activity: Not Currently    Birth control/protection: None  Other Topics Concern   Not on file  Social History Narrative   Not on file   Social Determinants of Health   Financial Resource Strain: Not on file  Food Insecurity: Not on file  Transportation Needs: Not on file  Physical Activity: Not on file  Stress: Not on file  Social Connections: Not on file  Intimate Partner Violence: Not on file    FAMILY HISTORY: Family History  Problem  Relation Age of Onset   Arthritis Mother    Heart disease Paternal Grandfather     ALLERGIES:  has No Known Allergies.  MEDICATIONS:  Current Outpatient Medications  Medication Sig Dispense Refill   amoxicillin-clavulanate (AUGMENTIN) 875-125 MG tablet Take 1 tablet by mouth 2 (two) times daily. 14 tablet 0   cyclobenzaprine (FLEXERIL) 5 MG tablet Take 1 tablet (5 mg total) by mouth at bedtime as needed for muscle spasms. 30 tablet 0   escitalopram (LEXAPRO) 20 MG tablet Take 1 tablet (20 mg total) by mouth daily. 30 tablet 1   cyanocobalamin 1000 MCG tablet Take 1,000 mcg by mouth daily. (Patient not taking: Reported on 10/11/2022)     ferrous sulfate 325 (65 FE) MG tablet Take 325 mg by mouth daily with breakfast. (Patient not taking: Reported on 08/19/2022)     No current facility-administered medications for this visit.    REVIEW OF SYSTEMS:   Pertinent information mentioned in HPI All other systems were reviewed with the patient and are negative.  PHYSICAL EXAMINATION: ECOG PERFORMANCE STATUS: 0 - Asymptomatic  Vitals:   10/22/22 1134  BP: 123/82  Pulse: 91  Resp: 20  Temp: (!) 96.9 F (36.1 C)   SpO2: 100%   Filed Weights   10/22/22 1134  Weight: (!) 312 lb 8 oz (141.7 kg)    GENERAL:alert, no distress and comfortable SKIN: skin color, texture, turgor are normal, no rashes or significant lesions EYES: normal, conjunctiva are pink and non-injected, sclera clear OROPHARYNX:no exudate, no erythema and lips, buccal mucosa, and tongue normal  NECK: supple, thyroid normal size, non-tender, without nodularity LYMPH:  no palpable lymphadenopathy in the cervical, axillary or inguinal LUNGS: clear to auscultation and percussion with normal breathing effort HEART: regular rate & rhythm and no murmurs and no lower extremity edema ABDOMEN:abdomen soft, non-tender and normal bowel sounds Musculoskeletal:no cyanosis of digits and no clubbing  PSYCH: alert & oriented x 3 with fluent speech NEURO: no focal motor/sensory deficits  LABORATORY DATA:  I have reviewed the data as listed Lab Results  Component Value Date   WBC 7.0 10/11/2022   HGB 11.5 (L) 10/11/2022   HCT 35.3 (L) 10/11/2022   MCV 78.9 10/11/2022   PLT 338.0 10/11/2022   Recent Labs    10/28/21 0842 08/04/22 1136  NA 138 141  K 4.2 4.3  CL 103 103  CO2 29 28  GLUCOSE 88 83  BUN 14 12  CREATININE 0.69 0.59  CALCIUM 9.2 8.9  PROT 7.7 7.4  ALBUMIN 4.1 4.0  AST 14 14  ALT 13 13  ALKPHOS 93 92  BILITOT 0.6 0.9  BILIDIR 0.1 0.2    RADIOGRAPHIC STUDIES: I have personally reviewed the radiological images as listed and agreed with the findings in the report. MM Outside Films Mammo  Result Date: 10/22/2022 This examination belongs to an outside facility and is stored here for comparison purposes only.  Contact the originating outside institution for any associated report or interpretation.  MM Outside Films Mammo  Result Date: 10/22/2022 This examination belongs to an outside facility and is stored here for comparison purposes only.  Contact the originating outside institution for any associated report or  interpretation.  MM Outside Films Mammo  Result Date: 10/22/2022 This examination belongs to an outside facility and is stored here for comparison purposes only.  Contact the originating outside institution for any associated report or interpretation.  MM Outside Films Mammo  Result Date: 10/22/2022 This examination belongs to an  outside facility and is stored here for comparison purposes only.  Contact the originating outside institution for any associated report or interpretation.  MM Outside Films Mammo  Result Date: 10/22/2022 This examination belongs to an outside facility and is stored here for comparison purposes only.  Contact the originating outside institution for any associated report or interpretation.

## 2022-10-29 ENCOUNTER — Ambulatory Visit
Admission: RE | Admit: 2022-10-29 | Discharge: 2022-10-29 | Disposition: A | Discharge status: Home / Self Care | Payer: PRIVATE HEALTH INSURANCE | Source: Ambulatory Visit | Attending: Internal Medicine | Admitting: Internal Medicine

## 2022-10-29 DIAGNOSIS — R9389 Abnormal findings on diagnostic imaging of other specified body structures: Secondary | ICD-10-CM | POA: Insufficient documentation

## 2022-10-29 MED ORDER — IOHEXOL 300 MG/ML  SOLN
75.0000 mL | Freq: Once | INTRAMUSCULAR | Status: AC | PRN
  Administered 2022-10-29: 75 mL via INTRAVENOUS

## 2022-11-04 ENCOUNTER — Ambulatory Visit
Admission: RE | Admit: 2022-11-04 | Discharge: 2022-11-04 | Disposition: A | Discharge status: Home / Self Care | Payer: PRIVATE HEALTH INSURANCE | Source: Ambulatory Visit | Attending: Internal Medicine | Admitting: Internal Medicine

## 2022-11-04 DIAGNOSIS — Z1231 Encounter for screening mammogram for malignant neoplasm of breast: Secondary | ICD-10-CM | POA: Insufficient documentation

## 2022-11-08 ENCOUNTER — Ambulatory Visit: Payer: PRIVATE HEALTH INSURANCE

## 2022-11-12 ENCOUNTER — Encounter: Payer: Self-pay | Admitting: Internal Medicine

## 2022-11-15 NOTE — Telephone Encounter (Signed)
Called patient. Unable to leave message. Will send my chart to patient.

## 2022-11-17 ENCOUNTER — Encounter: Payer: Self-pay | Admitting: Internal Medicine

## 2022-11-17 DIAGNOSIS — R300 Dysuria: Secondary | ICD-10-CM

## 2022-11-18 ENCOUNTER — Other Ambulatory Visit (INDEPENDENT_AMBULATORY_CARE_PROVIDER_SITE_OTHER): Payer: PRIVATE HEALTH INSURANCE

## 2022-11-18 ENCOUNTER — Telehealth (INDEPENDENT_AMBULATORY_CARE_PROVIDER_SITE_OTHER): Payer: PRIVATE HEALTH INSURANCE | Admitting: Internal Medicine

## 2022-11-18 ENCOUNTER — Other Ambulatory Visit: Payer: Self-pay | Admitting: *Deleted

## 2022-11-18 DIAGNOSIS — R319 Hematuria, unspecified: Secondary | ICD-10-CM

## 2022-11-18 DIAGNOSIS — R3915 Urgency of urination: Secondary | ICD-10-CM | POA: Diagnosis not present

## 2022-11-18 DIAGNOSIS — R300 Dysuria: Secondary | ICD-10-CM

## 2022-11-18 LAB — URINALYSIS, ROUTINE W REFLEX MICROSCOPIC
Bilirubin Urine: NEGATIVE
Ketones, ur: NEGATIVE
Nitrite: NEGATIVE
Specific Gravity, Urine: 1.02 (ref 1.000–1.030)
Total Protein, Urine: NEGATIVE
Urine Glucose: NEGATIVE
Urobilinogen, UA: 0.2 (ref 0.0–1.0)
pH: 6.5 (ref 5.0–8.0)

## 2022-11-18 MED ORDER — CEFDINIR 300 MG PO CAPS: 300.0000 mg | ORAL_CAPSULE | Freq: Two times a day (BID) | ORAL | 0 refills | Status: DC

## 2022-11-18 NOTE — Telephone Encounter (Signed)
Labs are placed and Video my chart visit is scheduled

## 2022-11-18 NOTE — Telephone Encounter (Signed)
Patient is coming in today to give a urine.

## 2022-11-21 LAB — URINE CULTURE
MICRO NUMBER:: 14995834
SPECIMEN QUALITY:: ADEQUATE

## 2022-11-23 NOTE — Progress Notes (Signed)
Patient ID: Brittney Nash, female   DOB: 12/30/1975, 47 y.o.   MRN: [id number]   Virtual Visit via video Note  All issues noted in this document were discussed and addressed.  No physical exam was performed (except for noted visual exam findings with Video Visits).   I connected with Viviana Simpler by a video enabled telemedicine application and verified that I am speaking with the correct person using two identifiers. Location patient: home Location provider: work  Persons participating in the virtual visit: patient, provider  The limitations, risks, security and privacy concerns of performing an evaluation and management service by video and the availability of in person appointments have been discussed.  It has also been discussed with the patient that there may be a patient responsible charge related to this service. The patient expressed understanding and agreed to proceed.   Reason for visit: work in appt  HPI: Work in with concerns regarding possible UTI.  Symptoms started 10 days ago. Noticed increased urgency.  Cramping after urination.  Increased frequency.  No fever.  No vomiting.  Some diarrhea for a couple of days.  Took AZO 3 days.     ROS: See pertinent positives and negatives per HPI.  Past Medical History:  Diagnosis Date   Arthritis 2013   Breast cancer (HCC) 08/26/2016   History of femur fracture 2004   s/p surgery with rod placement    Past Surgical History:  Procedure Laterality Date   BREAST LUMPECTOMY Left 11/02/2016   LEG SURGERY Left     Family History  Problem Relation Age of Onset   Arthritis Mother    Heart disease Paternal Grandfather     SOCIAL HX: reviewed.    Current Outpatient Medications:    cefdinir (OMNICEF) 300 MG capsule, Take 1 capsule (300 mg total) by mouth 2 (two) times daily., Disp: 10 capsule, Rfl: 0   cyclobenzaprine (FLEXERIL) 5 MG tablet, Take 1 tablet (5 mg total) by mouth at bedtime as needed for muscle spasms., Disp: 30  tablet, Rfl: 0   escitalopram (LEXAPRO) 20 MG tablet, Take 1 tablet (20 mg total) by mouth daily., Disp: 30 tablet, Rfl: 1   ferrous sulfate 325 (65 FE) MG tablet, Take 325 mg by mouth daily with breakfast., Disp: , Rfl:    cyanocobalamin 1000 MCG tablet, Take 1,000 mcg by mouth daily. (Patient not taking: Reported on 10/11/2022), Disp: , Rfl:   EXAM:  GENERAL: alert, oriented, appears well and in no acute distress  HEENT: atraumatic, conjunttiva clear, no obvious abnormalities on inspection of external nose and ears  NECK: normal movements of the head and neck  LUNGS: on inspection no signs of respiratory distress, breathing rate appears normal, no obvious gross SOB, gasping or wheezing  CV: no obvious cyanosis  PSYCH/NEURO: pleasant and cooperative, no obvious depression or anxiety, speech and thought processing grossly intact  ASSESSMENT AND PLAN:  Discussed the following assessment and plan:  Problem List Items Addressed This Visit     Urinary urgency - Primary    Symptoms of frequency and urgency as outlined.  Urinalysis and symptoms c/w UTI.  Treat with omnicef as directed.  Stay hydrated.  Await urine culture results.  Will need f/u urinalysis after complete abx to confirm blood clears.        Hematuria    Will need f/u urinalysis to confirm blood clears.         Return if symptoms worsen or fail to improve.   I discussed  the assessment and treatment plan with the patient. The patient was provided an opportunity to ask questions and all were answered. The patient agreed with the plan and demonstrated an understanding of the instructions.   The patient was advised to call back or seek an in-person evaluation if the symptoms worsen or if the condition fails to improve as anticipated.    Dale [place], MD

## 2022-11-28 ENCOUNTER — Encounter: Payer: Self-pay | Admitting: Internal Medicine

## 2022-11-28 DIAGNOSIS — R3915 Urgency of urination: Secondary | ICD-10-CM | POA: Insufficient documentation

## 2022-11-28 NOTE — Assessment & Plan Note (Signed)
Will need f/u urinalysis to confirm blood clears.

## 2022-11-28 NOTE — Assessment & Plan Note (Signed)
Symptoms of frequency and urgency as outlined.  Urinalysis and symptoms c/w UTI.  Treat with omnicef as directed.  Stay hydrated.  Await urine culture results.  Will need f/u urinalysis after complete abx to confirm blood clears.

## 2022-12-10 ENCOUNTER — Ambulatory Visit (INDEPENDENT_AMBULATORY_CARE_PROVIDER_SITE_OTHER): Payer: PRIVATE HEALTH INSURANCE | Admitting: Internal Medicine

## 2022-12-10 VITALS — BP 112/70 | HR 90 | Temp 98.0°F | Resp 16 | Ht 59.0 in | Wt 309.8 lb

## 2022-12-10 DIAGNOSIS — F419 Anxiety disorder, unspecified: Secondary | ICD-10-CM | POA: Diagnosis not present

## 2022-12-10 DIAGNOSIS — D508 Other iron deficiency anemias: Secondary | ICD-10-CM

## 2022-12-10 DIAGNOSIS — C50912 Malignant neoplasm of unspecified site of left female breast: Secondary | ICD-10-CM | POA: Diagnosis not present

## 2022-12-10 DIAGNOSIS — K219 Gastro-esophageal reflux disease without esophagitis: Secondary | ICD-10-CM

## 2022-12-10 DIAGNOSIS — N949 Unspecified condition associated with female genital organs and menstrual cycle: Secondary | ICD-10-CM

## 2022-12-10 DIAGNOSIS — R319 Hematuria, unspecified: Secondary | ICD-10-CM | POA: Diagnosis not present

## 2022-12-10 DIAGNOSIS — Z1211 Encounter for screening for malignant neoplasm of colon: Secondary | ICD-10-CM

## 2022-12-10 DIAGNOSIS — E236 Other disorders of pituitary gland: Secondary | ICD-10-CM

## 2022-12-10 DIAGNOSIS — F439 Reaction to severe stress, unspecified: Secondary | ICD-10-CM

## 2022-12-10 NOTE — Progress Notes (Unsigned)
Subjective:    Patient ID: Brittney Nash, female    DOB: October 26, 1975, 47 y.o.   MRN: [id number]  Patient here for  Chief Complaint  Patient presents with   Medical Management of Chronic Issues    HPI Here to follow up regarding iron deficient anemia and history of breast cancer. Also on lexapro - to help with anxiety.  Saw oncology 09/2022 - for follow up - breast cancer.  Mammogram 11/05/22 - Birads II. Tolerating iron.  Has turned her stool dark.  No significant problems.  Had follow up CT neck - no acute process in the neck. Partial empty sella and tortuous optic nerves, which are nonspecific but can be seen in the setting of idiopathic intracranial hypertension.discussed CT findings with neurology.  Needs appt scheduled.  No significant headaches or dizziness.  Overall feeling ok. No chest pain or sob reported.  No abdominal pain reported.     Past Medical History:  Diagnosis Date   Arthritis 2013   Breast cancer (HCC) 08/26/2016   History of femur fracture 2004   s/p surgery with rod placement   Past Surgical History:  Procedure Laterality Date   BREAST LUMPECTOMY Left 11/02/2016   LEG SURGERY Left    Family History  Problem Relation Age of Onset   Arthritis Mother    Heart disease Paternal Grandfather    Social History   Socioeconomic History   Marital status: Single    Spouse name: Not on file   Number of children: Not on file   Years of education: Not on file   Highest education level: Not on file  Occupational History   Not on file  Tobacco Use   Smoking status: Never   Smokeless tobacco: Never  Substance and Sexual Activity   Alcohol use: Yes    Comment: occ 1-2 times a month    Drug use: No   Sexual activity: Not Currently    Birth control/protection: None  Other Topics Concern   Not on file  Social History Narrative   Not on file   Social Determinants of Health   Financial Resource Strain: Not on file  Food Insecurity: Not on file  Transportation  Needs: Not on file  Physical Activity: Not on file  Stress: Not on file  Social Connections: Not on file     Review of Systems  Constitutional:  Negative for appetite change and unexpected weight change.  HENT:  Negative for congestion and sinus pressure.   Respiratory:  Negative for cough, chest tightness and shortness of breath.   Cardiovascular:  Negative for chest pain, palpitations and leg swelling.  Gastrointestinal:  Negative for abdominal pain, diarrhea, nausea and vomiting.  Genitourinary:  Negative for difficulty urinating and dysuria.  Musculoskeletal:  Negative for joint swelling and myalgias.  Skin:  Negative for color change and rash.  Neurological:  Negative for dizziness.       No significant headaches.   Psychiatric/Behavioral:  Negative for agitation and dysphoric mood.        Objective:     BP 112/70   Pulse 90   Temp 98 F (36.7 C)   Resp 16   Ht 4\' 11"  (1.499 m)   Wt (!) 309 lb 12.8 oz (140.5 kg)   SpO2 98%   BMI 62.57 kg/m  Wt Readings from Last 3 Encounters:  12/10/22 (!) 309 lb 12.8 oz (140.5 kg)  10/22/22 (!) 312 lb 8 oz (141.7 kg)  10/11/22 (!) 304 lb (137.9  kg)    Physical Exam Vitals reviewed.  Constitutional:      General: She is not in acute distress.    Appearance: Normal appearance.  HENT:     Head: Normocephalic and atraumatic.     Right Ear: External ear normal.     Left Ear: External ear normal.  Eyes:     General: No scleral icterus.       Right eye: No discharge.        Left eye: No discharge.     Conjunctiva/sclera: Conjunctivae normal.  Neck:     Thyroid: No thyromegaly.  Cardiovascular:     Rate and Rhythm: Normal rate and regular rhythm.  Pulmonary:     Effort: No respiratory distress.     Breath sounds: Normal breath sounds. No wheezing.  Abdominal:     General: Bowel sounds are normal.     Palpations: Abdomen is soft.     Tenderness: There is no abdominal tenderness.  Musculoskeletal:        General: No  swelling or tenderness.     Cervical back: Neck supple. No tenderness.  Lymphadenopathy:     Cervical: No cervical adenopathy.  Skin:    Findings: No erythema or rash.  Neurological:     Mental Status: She is alert.  Psychiatric:        Mood and Affect: Mood normal.        Behavior: Behavior normal.      Outpatient Encounter Medications as of 12/10/2022  Medication Sig   cyanocobalamin 1000 MCG tablet Take 1,000 mcg by mouth daily.   cyclobenzaprine (FLEXERIL) 5 MG tablet Take 1 tablet (5 mg total) by mouth at bedtime as needed for muscle spasms.   escitalopram (LEXAPRO) 20 MG tablet Take 1 tablet (20 mg total) by mouth daily.   ferrous sulfate 325 (65 FE) MG tablet Take 325 mg by mouth daily with breakfast.   [DISCONTINUED] cefdinir (OMNICEF) 300 MG capsule Take 1 capsule (300 mg total) by mouth 2 (two) times daily.   No facility-administered encounter medications on file as of 12/10/2022.     Lab Results  Component Value Date   WBC 7.0 10/11/2022   HGB 11.5 (L) 10/11/2022   HCT 35.3 (L) 10/11/2022   PLT 338.0 10/11/2022   GLUCOSE 83 08/04/2022   CHOL 156 08/04/2022   TRIG 56.0 08/04/2022   HDL 63.40 08/04/2022   LDLCALC 82 08/04/2022   ALT 13 08/04/2022   AST 14 08/04/2022   NA 141 08/04/2022   K 4.3 08/04/2022   CL 103 08/04/2022   CREATININE 0.59 08/04/2022   BUN 12 08/04/2022   CO2 28 08/04/2022   TSH 1.73 08/04/2022   INR 1.1 09/02/2011    MM 3D SCREENING MAMMOGRAM BILATERAL BREAST  Result Date: 11/05/2022 CLINICAL DATA:  Screening. Personal history of malignant LEFT breast lumpectomy in 2018. EXAM: DIGITAL SCREENING BILATERAL MAMMOGRAM WITH TOMOSYNTHESIS AND CAD TECHNIQUE: Bilateral screening digital craniocaudal and mediolateral oblique mammograms were obtained. Bilateral screening digital breast tomosynthesis was performed. The images were evaluated with computer-aided detection. COMPARISON:  Previous exam 12/01/2018 and earlier from [facility] in [place], [place]. ACR Breast Density Category c: The breasts are heterogeneously dense, which may obscure small masses. FINDINGS: There are no findings suspicious for malignancy. Expected post lumpectomy changes involving the LEFT breast, including benign dystrophic calcifications. IMPRESSION: No mammographic evidence of malignancy. A result letter of this screening mammogram will be mailed directly to the patient. RECOMMENDATION: Screening mammogram  in one year. (Code:SM-B-01Y) BI-RADS CATEGORY  2: Benign. Electronically Signed   By: Hulan Saas M.D.   On: 11/05/2022 08:38       Assessment & Plan:  Hematuria, unspecified type Assessment & Plan: Needs f/u urinalysis to confirm blood clears.  Check urine today.   Orders: -     Urinalysis, Routine w reflex microscopic; Future  Adnexal cyst Assessment & Plan: Had noted left adnexal cyst on previous MRI 07/2018.  Saw gyn.  Had f/u pelvic ultrasound 08/2018.  Continued to f/u with gyn throughout her pregnancy in 2020.  She is planning to schedule a f/u with gyn - Dr Feliberto Gottron. Needs f/u scheduled.   Anxiety Assessment & Plan: Continue lexapro.  Overall appears to be stable.  Follow.    Malignant neoplasm of left female breast, unspecified estrogen receptor status, unspecified site of breast [facility]) Assessment & Plan: Was seeing Dr Archie Balboa at [facility].  No longer at [facility] (per pt).  Established with oncology here 09/2022.   Mammogram 11/05/22 - Birads II.   Colon cancer screening Assessment & Plan: Needs colonoscopy (and GI evaluation for possible upper endoscopy) for IDA and due screening.  Referral has been placed to GI.    Gastroesophageal reflux disease, unspecified whether esophagitis present Assessment & Plan: PPI daily.  Follow. GI referral has been placed.  History of IDA and need for PPI.  Evaluate for question of need for EGD.    Other iron deficiency anemia Assessment & Plan: Has seen oncology.  Tolerating iron.  Follow cbc  and iron studies.  Already referred to GI for further evaluation - colonoscopy, EGD, etc.  Menstrual periods - not significantly heavy.    Stress Assessment & Plan: Appears to be doing well on lexapro.  Follow.    Empty sella (HCC) Assessment & Plan: Had follow up CT neck - no acute process in the neck. Partial empty sella and tortuous optic nerves, which are nonspecific but can be seen in the setting of idiopathic intracranial hypertension.discussed CT findings with neurology.  Needs appt scheduled.   Orders: -     Ambulatory referral to Neurology     Dale Spel[place], MD

## 2022-12-12 ENCOUNTER — Encounter: Payer: Self-pay | Admitting: Internal Medicine

## 2022-12-12 DIAGNOSIS — E236 Other disorders of pituitary gland: Secondary | ICD-10-CM | POA: Insufficient documentation

## 2022-12-12 NOTE — Assessment & Plan Note (Signed)
Appears to be doing well on lexapro.  Follow.  ?

## 2022-12-12 NOTE — Assessment & Plan Note (Signed)
PPI daily.  Follow. GI referral has been placed.  History of IDA and need for PPI.  Evaluate for question of need for EGD.

## 2022-12-12 NOTE — Assessment & Plan Note (Signed)
Needs colonoscopy (and GI evaluation for possible upper endoscopy) for IDA and due screening.  Referral has been placed to GI.

## 2022-12-12 NOTE — Assessment & Plan Note (Signed)
Continue lexapro.  Overall appears to be stable.  Follow.  

## 2022-12-12 NOTE — Assessment & Plan Note (Signed)
Was seeing Dr Archie Balboa at [facility].  No longer at [facility] (per pt).  Established with oncology here 09/2022.   Mammogram 11/05/22 - Birads II.

## 2022-12-12 NOTE — Assessment & Plan Note (Signed)
Had noted left adnexal cyst on previous MRI 07/2018.  Saw gyn.  Had f/u pelvic ultrasound 08/2018.  Continued to f/u with gyn throughout her pregnancy in 2020.  She is planning to schedule a f/u with gyn - Dr Schermerhorn. Needs f/u scheduled. 

## 2022-12-12 NOTE — Assessment & Plan Note (Signed)
Has seen oncology.  Tolerating iron.  Follow cbc and iron studies.  Already referred to GI for further evaluation - colonoscopy, EGD, etc.  Menstrual periods - not significantly heavy.

## 2022-12-12 NOTE — Assessment & Plan Note (Signed)
Had follow up CT neck - no acute process in the neck. Partial empty sella and tortuous optic nerves, which are nonspecific but can be seen in the setting of idiopathic intracranial hypertension.discussed CT findings with neurology.  Needs appt scheduled.

## 2022-12-12 NOTE — Assessment & Plan Note (Signed)
Needs f/u urinalysis to confirm blood clears.  Check urine today.

## 2022-12-14 ENCOUNTER — Ambulatory Visit: Payer: PRIVATE HEALTH INSURANCE | Admitting: Internal Medicine

## 2022-12-21 ENCOUNTER — Ambulatory Visit: Payer: PRIVATE HEALTH INSURANCE | Admitting: Internal Medicine

## 2023-02-10 ENCOUNTER — Encounter (INDEPENDENT_AMBULATORY_CARE_PROVIDER_SITE_OTHER): Payer: Self-pay

## 2023-02-12 ENCOUNTER — Ambulatory Visit
Admission: RE | Admit: 2023-02-12 | Discharge: 2023-02-12 | Disposition: A | Discharge status: Home / Self Care | Payer: PRIVATE HEALTH INSURANCE | Source: Ambulatory Visit | Attending: Physician Assistant | Admitting: Physician Assistant

## 2023-02-12 VITALS — BP 138/95 | HR 99 | Temp 98.9°F | Resp 14 | Ht 59.0 in | Wt 309.7 lb

## 2023-02-12 DIAGNOSIS — K047 Periapical abscess without sinus: Secondary | ICD-10-CM | POA: Diagnosis not present

## 2023-02-12 DIAGNOSIS — K0889 Other specified disorders of teeth and supporting structures: Secondary | ICD-10-CM

## 2023-02-12 MED ORDER — KETOROLAC TROMETHAMINE 60 MG/2ML IM SOLN
30.0000 mg | Freq: Once | INTRAMUSCULAR | Status: AC
  Administered 2023-02-12: 30 mg via INTRAMUSCULAR

## 2023-02-12 MED ORDER — AMOXICILLIN-POT CLAVULANATE 875-125 MG PO TABS: 1.0000 | ORAL_TABLET | Freq: Two times a day (BID) | ORAL | 0 refills | Status: AC

## 2023-02-12 MED ORDER — HYDROCODONE-ACETAMINOPHEN 5-325 MG PO TABS: 1.0000 | ORAL_TABLET | Freq: Three times a day (TID) | ORAL | 0 refills | Status: AC | PRN

## 2023-02-12 NOTE — ED Provider Notes (Signed)
[facility]    CSN: [id number] Arrival date & time: 02/12/23  0809      History   Chief Complaint Chief Complaint  Patient presents with   Dental Pain    HPI Brittney Nash is a 47 y.o. female presenting for pain of the left upper lateral incisor.  She reports that she cracked the tooth on a poor groin a couple months ago.  Over the past few days she has been experiencing pain and notices there is a hole in the tooth.  She says that her gums are swollen and she feels her upper lip swollen.  No fever.  No drainage from the area.  Has been applying ice and taking over-the-counter medication without relief.  No meds taken today.  Does not currently dentist.  HPI  Past Medical History:  Diagnosis Date   Arthritis 2013   Breast cancer (HCC) 08/26/2016   History of femur fracture 2004   s/p surgery with rod placement    Patient Active Problem List   Diagnosis Date Noted   Empty sella (HCC) 12/12/2022   Urinary urgency 11/28/2022   Iron deficiency anemia 10/22/2022   Cervical lymphadenopathy 10/22/2022   Right sided facial pain 08/22/2022   Parotiditis 08/11/2022   Colon cancer screening 11/01/2021   Hematuria 04/24/2021   Dysuria 01/19/2021   Anxiety 04/26/2020   CTS (carpal tunnel syndrome) 06/23/2019   Healthcare maintenance 08/20/2018   Adnexal cyst 08/20/2018   Lumbar radiculitis 02/19/2018   Rash 09/23/2017   Menstrual irregularity 09/23/2017   Fatigue 08/26/2017   Breast pain, left 03/31/2017   Stress 03/31/2017   GERD (gastroesophageal reflux disease) 02/17/2017   Breast cancer (HCC) 01/09/2017   Skin irritation 01/09/2017   History of femur fracture 01/09/2017    Past Surgical History:  Procedure Laterality Date   BREAST LUMPECTOMY Left 11/02/2016   LEG SURGERY Left     OB History   No obstetric history on file.      Home Medications    Prior to Admission medications   Medication Sig Start Date End Date Taking? Authorizing Provider   amoxicillin-clavulanate (AUGMENTIN) 875-125 MG tablet Take 1 tablet by mouth every 12 (twelve) hours for 7 days. 02/12/23 02/19/23 Yes Shirlee Latch, PA-C  HYDROcodone-acetaminophen (NORCO) 5-325 MG tablet Take 1 tablet by mouth 3 (three) times daily as needed for up to 2 days for moderate pain or severe pain. 02/12/23 02/14/23 Yes Shirlee Latch, PA-C  cyanocobalamin 1000 MCG tablet Take 1,000 mcg by mouth daily.    [provider]  cyclobenzaprine (FLEXERIL) 5 MG tablet Take 1 tablet (5 mg total) by mouth at bedtime as needed for muscle spasms. 06/25/22   Eulis Foster, FNP  escitalopram (LEXAPRO) 20 MG tablet Take 1 tablet (20 mg total) by mouth daily. 10/11/22   Dale [place], MD  ferrous sulfate 325 (65 FE) MG tablet Take 325 mg by mouth daily with breakfast.    [provider]    Family History Family History  Problem Relation Age of Onset   Arthritis Mother    Heart disease Paternal Grandfather     Social History Social History   Tobacco Use   Smoking status: Never   Smokeless tobacco: Never  Vaping Use   Vaping status: Never Used  Substance Use Topics   Alcohol use: Yes    Comment: occ 1-2 times a month    Drug use: No     Allergies   Patient has no  known allergies.   Review of Systems Review of Systems  Constitutional:  Negative for fatigue and fever.  HENT:  Positive for dental problem. Negative for facial swelling.   Neurological:  Negative for weakness and headaches.  Hematological:  Negative for adenopathy.     Physical Exam Triage Vital Signs ED Triage Vitals  Encounter Vitals Group     BP 02/12/23 0817 (!) 138/95     Systolic BP Percentile --      Diastolic BP Percentile --      Pulse Rate 02/12/23 0817 99     Resp 02/12/23 0817 14     Temp 02/12/23 0817 98.9 F (37.2 C)     Temp Source 02/12/23 0817 Oral     SpO2 02/12/23 0817 95 %     Weight 02/12/23 0815 (!) 309 lb 11.9 oz (140.5 kg)     Height 02/12/23 0815 4\' 11"   (1.499 m)     Head Circumference --      Peak Flow --      Pain Score 02/12/23 0815 7     Pain Loc --      Pain Education --      Exclude from Growth Chart --    No data found.  Updated Vital Signs BP (!) 138/95 (BP Location: Left Arm)   Pulse 99   Temp 98.9 F (37.2 C) (Oral)   Resp 14   Ht 4\' 11"  (1.499 m)   Wt (!) 309 lb 11.9 oz (140.5 kg)   LMP 01/17/2023 (Approximate)   SpO2 95%   BMI 62.56 kg/m    Physical Exam Vitals and nursing note reviewed.  Constitutional:      General: She is not in acute distress.    Appearance: Normal appearance. She is not ill-appearing or toxic-appearing.  HENT:     Head: Normocephalic and atraumatic.     Nose: Nose normal.     Mouth/Throat:     Mouth: Mucous membranes are moist.     Dentition: Abnormal dentition (Small part of left upper lateral incisor missing with erythema/swelling of gingiva. tooth is TTP). Dental tenderness present.     Pharynx: Oropharynx is clear.   Eyes:     General: No scleral icterus.       Right eye: No discharge.        Left eye: No discharge.     Conjunctiva/sclera: Conjunctivae normal.  Cardiovascular:     Rate and Rhythm: Normal rate and regular rhythm.  Pulmonary:     Effort: Pulmonary effort is normal. No respiratory distress.  Musculoskeletal:     Cervical back: Neck supple.  Skin:    General: Skin is dry.  Neurological:     General: No focal deficit present.     Mental Status: She is alert. Mental status is at baseline.     Motor: No weakness.     Gait: Gait normal.  Psychiatric:        Mood and Affect: Mood normal.        Behavior: Behavior normal.        Thought Content: Thought content normal.      UC Treatments / Results  Labs (all labs ordered are listed, but only abnormal results are displayed) Labs Reviewed - No data to display  EKG   Radiology No results found.  Procedures Procedures (including critical care time)  Medications Ordered in UC Medications  ketorolac  (TORADOL) injection 30 mg (30 mg Intramuscular Given 02/12/23 0843)  Initial Impression / Assessment and Plan / UC Course  I have reviewed the triage vital signs and the nursing notes.  Pertinent labs & imaging results that were available during my care of the patient were reviewed by me and considered in my medical decision making (see chart for details).   47 year old female presents for pain of the left upper lateral incisor for the past couple of days.  She reports that she broke the tooth while eating pork rinds a couple months ago.  Does not have a dentist.  On exam she does have a piece of the tooth that is missing with surrounding erythema and swelling of the tooth and tenderness.  Suspect dental infection.  Treating with Augmentin.  Patient given 30 mg IM ketorolac for acute pain relief.  Also sent Norco to pharmacy after reviewing controlled substance database.  Advised patient of supportive care.  Encouraged her to follow-up with a dentist.  ED precautions given.   Final Clinical Impressions(s) / UC Diagnoses   Final diagnoses:  Pain, dental  Dental infection     Discharge Instructions      -I sent antibiotics to pharmacy and pain medicine. -Follow up with dentist.     ED Prescriptions     Medication Sig Dispense Auth. Provider   amoxicillin-clavulanate (AUGMENTIN) 875-125 MG tablet Take 1 tablet by mouth every 12 (twelve) hours for 7 days. 14 tablet Eusebio Friendly B, PA-C   HYDROcodone-acetaminophen (NORCO) 5-325 MG tablet Take 1 tablet by mouth 3 (three) times daily as needed for up to 2 days for moderate pain or severe pain. 6 tablet Shirlee Latch, PA-C      I have reviewed the PDMP during this encounter.   Shirlee Latch, PA-C 02/12/23 [phone]

## 2023-02-12 NOTE — Discharge Instructions (Signed)
-  I sent antibiotics to pharmacy and pain medicine. -Follow up with dentist.

## 2023-02-12 NOTE — ED Triage Notes (Signed)
Patient states that she chipped the back or her front upper tooth on some pork rinds about a month ago.  Patient states that for the past 2 days she has had pain in that chipped tooth.

## 2023-02-21 ENCOUNTER — Inpatient Hospital Stay: Payer: PRIVATE HEALTH INSURANCE | Attending: Internal Medicine

## 2023-02-21 ENCOUNTER — Inpatient Hospital Stay (HOSPITAL_BASED_OUTPATIENT_CLINIC_OR_DEPARTMENT_OTHER): Payer: PRIVATE HEALTH INSURANCE | Admitting: Internal Medicine

## 2023-02-21 VITALS — BP 130/84 | HR 81 | Temp 97.3°F | Wt 311.0 lb

## 2023-02-21 DIAGNOSIS — Z853 Personal history of malignant neoplasm of breast: Secondary | ICD-10-CM | POA: Diagnosis present

## 2023-02-21 DIAGNOSIS — D509 Iron deficiency anemia, unspecified: Secondary | ICD-10-CM | POA: Insufficient documentation

## 2023-02-21 DIAGNOSIS — Z923 Personal history of irradiation: Secondary | ICD-10-CM | POA: Insufficient documentation

## 2023-02-21 DIAGNOSIS — C50912 Malignant neoplasm of unspecified site of left female breast: Secondary | ICD-10-CM

## 2023-02-21 DIAGNOSIS — D508 Other iron deficiency anemias: Secondary | ICD-10-CM | POA: Diagnosis not present

## 2023-02-21 LAB — CBC WITH DIFFERENTIAL/PLATELET
Abs Immature Granulocytes: 0.01 10*3/uL (ref 0.00–0.07)
Basophils Absolute: 0 10*3/uL (ref 0.0–0.1)
Basophils Relative: 0 %
Eosinophils Absolute: 0.2 10*3/uL (ref 0.0–0.5)
Eosinophils Relative: 3 %
HCT: 36.5 % (ref 36.0–46.0)
Hemoglobin: 11.5 g/dL — ABNORMAL LOW (ref 12.0–15.0)
Immature Granulocytes: 0 %
Lymphocytes Relative: 36 %
Lymphs Abs: 1.9 10*3/uL (ref 0.7–4.0)
MCH: 26.4 pg (ref 26.0–34.0)
MCHC: 31.5 g/dL (ref 30.0–36.0)
MCV: 83.7 fL (ref 80.0–100.0)
Monocytes Absolute: 0.3 10*3/uL (ref 0.1–1.0)
Monocytes Relative: 6 %
Neutro Abs: 2.8 10*3/uL (ref 1.7–7.7)
Neutrophils Relative %: 55 %
Platelets: 321 10*3/uL (ref 150–400)
RBC: 4.36 MIL/uL (ref 3.87–5.11)
RDW: 13.4 % (ref 11.5–15.5)
WBC: 5.2 10*3/uL (ref 4.0–10.5)
nRBC: 0 % (ref 0.0–0.2)

## 2023-02-21 LAB — IRON AND TIBC
Iron: 48 ug/dL (ref 28–170)
Saturation Ratios: 12 % (ref 10.4–31.8)
TIBC: 414 ug/dL (ref 250–450)
UIBC: 366 ug/dL

## 2023-02-21 LAB — FERRITIN: Ferritin: 25 ng/mL (ref 11–307)

## 2023-02-21 NOTE — Progress Notes (Signed)
Patient says that she is doing well, she has noticed that her right hand is having more tingling and numbness than before, she thinks it is coming from her carpal tunnel.

## 2023-02-21 NOTE — Progress Notes (Signed)
[facility] CONSULT NOTE  Patient Care Team: Dale Camp Three, MD as PCP - General (Internal Medicine) Michaelyn Barter, MD as Consulting Physician (Oncology)  REFERRING PROVIDER: Self referred  REASON FOR REFFERAL: History of left breast IDC Dx March 2018.   CANCER STAGING   Cancer Staging  Breast cancer (HCC) Staging form: Breast, AJCC 8th Edition - Pathologic: Stage IA (pT1c, pN0(sn), cM0, G1, ER+, PR+, HER2-, Oncotype DX score: 15) - Signed by Michaelyn Barter, MD on 10/22/2022 Stage prefix: Initial diagnosis Method of lymph node assessment: Sentinel lymph node biopsy Multigene prognostic tests performed: Oncotype DX Recurrence score range: Greater than or equal to 11 Histologic grading system: 3 grade system   ASSESSMENT & PLAN:  Brittney Nash 47 y.o. female with pmh of iron deficiency anemia and arthritis was referred to medical oncology for history of left breast cancer stage Ia diagnosed in March 2018.  # History of left breast IDC stage IA (pT1c N0, cM0) ER/PR positive, HER2 negative - self-detected stage IA (pT1c, pN0(sn), cM0), grade 1, ER 91-100%, PR 81-90%, HER 2 negative (0) IDC of the left breast status post left PM and SLNB with clear margins and 0/5 LN on 11/02/16 and adjuvant radiation 12/14/16-01/11/17. Her Oncotype DX recurrence score was 15 with a 10-year risk of disease recurrence with tam alone of 10%.  No benefit from chemotherapy.  -Completed whole breast RT from 12/14/2016 to 01/11/2017 total dose 527 2 cGy  -She completed 5 years of tamoxifen around March 2023.  Had all her care at [facility].  Was following with Dr. Archie Balboa.  Genetic testing was done with [facility] and was negative.  -Mammogram from May 2024 was negative.  Repeat in May 2025.  # Iron deficiency anemia --CBC today showed hemoglobin 11.5 unchanged from April 2024.  Iron panel is slowly improving.  Ferritin 25 and saturation 12%.  We briefly did discuss about iron infusions pending iron studies.  Iron level is improving with oral iron so we will continue with that.  Advised her to have repeat CBC with iron panel in 6 months.  I will follow-up with her in 1 year.  -Patient was scheduled to see Dr. Norma Fredrickson but missed her appointment.  Rescheduled for October 2024.  # Cervical lymphadenopathy - US STN (08/11/2022) -done for right parotid pain.  Was treated with 7 days of antibiotics.  Showed multiple prominent lymph nodes in the region of tenderness largest measuring 2.2 x 0.8 x 1.2 cm cortex thickened up to 7 mm.  Could be inflammatory/infectious or neoplastic  -CT STN from May 2024 was reviewed with no suspicious lymphadenopathy noted.   Orders Placed This Encounter  Procedures   MM 3D SCREENING MAMMOGRAM BILATERAL BREAST    Standing Status:   Future    Standing Expiration Date:   02/21/2024    Order Specific Question:   Reason for Exam (SYMPTOM  OR DIAGNOSIS REQUIRED)    Answer:   Hx of Breast Cancer    Order Specific Question:   Preferred imaging location?    Answer:   [facility]    Order Specific Question:   Is the patient pregnant?    Answer:   No   CBC with Differential/Platelet    Standing Status:   Future    Standing Expiration Date:   02/21/2024   Iron and TIBC    Standing Status:   Future    Standing Expiration Date:   02/21/2024   Ferritin    Standing Status:  Future    Standing Expiration Date:   02/21/2024   Iron and TIBC    Standing Status:   Future    Standing Expiration Date:   02/21/2024   Ferritin    Standing Status:   Future    Standing Expiration Date:   02/21/2024   CBC with Differential/Platelet    Standing Status:   Future    Standing Expiration Date:   02/21/2024    The total time spent in the appointment was 30 minutes encounter with patients including review of chart and various tests results, discussions about plan of care and coordination of care plan   All questions were answered. The patient knows to call the clinic with any problems,  questions or concerns. No barriers to learning was detected.  Michaelyn Barter, MD 8/26/20243:59 PM   HISTORY OF PRESENTING ILLNESS:  Brittney Nash 46 y.o. female with pmh of with self-detected stage IA (pT1c, pN0(sn), cM0), grade 1, ER 91-100%, PR 81-90%, HER 2 negative (0) IDC of the left breast status post left PM and SLNB with clear margins and 0/5 LN on 11/02/16 and adjuvant radiation 12/14/16-01/11/17. Her Oncotype DX recurrence score was 15 with a 10-year risk of disease recurrence with tam alone of 10%.   Oncology history- 09/09/2016 - Presenting Symptoms Palpable L breast mass. Dx MMG: L breast 1 cm irregular mass with associated calcs in central breast. An asymmetry in retroareolar R breast effaces.   Ultrasound: L breast 1 cm oval, circumscribed, hypoechoic mass in the central breast.  09/15/2016 Biopsy L breast USG core biopsy at [facility]: IDC, G1, ER+(100%), PR+(90%), HER2-(0).  11/02/2016 Surgery L PM with clear margins and SLNB (0/5 LN); IDC with DCIS, ER 91-100%, PR 81-100%, Her2neu negative, invasive carcinoma 16mm in greatest dimension   11/19/2016 Genetics Oncotype recurrence score result 15 with 10 year risk of distant recurrence with Tam alone 10%.  12/14/2016 - 01/11/2017 Radiation Whole breast RT 267 cGy x 16 fx = 4272 cGy Boost 250 cGy x 4 fx = 1000 cGy Total dose: 5272 cGy  Interval history Patient was seen today as follow-up for breast cancer and iron deficiency anemia. She is feeling well overall.  Right hand has more tingling and numbness.  Taking her oral iron every day.  No side effects noted.  Missed her GI appointment.  Rescheduled in October 2024.  Denies any bleeding in urine or stool.  MEDICAL HISTORY:  Past Medical History:  Diagnosis Date   Arthritis 2013   Breast cancer (HCC) 08/26/2016   History of femur fracture 2004   s/p surgery with rod placement    SURGICAL HISTORY: Past Surgical History:  Procedure Laterality Date   BREAST LUMPECTOMY  Left 11/02/2016   LEG SURGERY Left     SOCIAL HISTORY: Social History   Socioeconomic History   Marital status: Single    Spouse name: Not on file   Number of children: Not on file   Years of education: Not on file   Highest education level: Not on file  Occupational History   Not on file  Tobacco Use   Smoking status: Never   Smokeless tobacco: Never  Vaping Use   Vaping status: Never Used  Substance and Sexual Activity   Alcohol use: Yes    Comment: occ 1-2 times a month    Drug use: No   Sexual activity: Not Currently    Birth control/protection: None  Other Topics Concern   Not on file  Social History Narrative  Not on file   Social Determinants of Health   Financial Resource Strain: Not on file  Food Insecurity: Not on file  Transportation Needs: Not on file  Physical Activity: Not on file  Stress: Not on file  Social Connections: Not on file  Intimate Partner Violence: Not on file    FAMILY HISTORY: Family History  Problem Relation Age of Onset   Arthritis Mother    Heart disease Paternal Grandfather     ALLERGIES:  has No Known Allergies.  MEDICATIONS:  Current Outpatient Medications  Medication Sig Dispense Refill   cyanocobalamin 1000 MCG tablet Take 1,000 mcg by mouth daily.     cyclobenzaprine (FLEXERIL) 5 MG tablet Take 1 tablet (5 mg total) by mouth at bedtime as needed for muscle spasms. 30 tablet 0   escitalopram (LEXAPRO) 20 MG tablet Take 1 tablet (20 mg total) by mouth daily. 30 tablet 1   ferrous sulfate 325 (65 FE) MG tablet Take 325 mg by mouth daily with breakfast.     No current facility-administered medications for this visit.    REVIEW OF SYSTEMS:   Pertinent information mentioned in HPI All other systems were reviewed with the patient and are negative.  PHYSICAL EXAMINATION: ECOG PERFORMANCE STATUS: 0 - Asymptomatic  Vitals:   02/21/23 1056  BP: 130/84  Pulse: 81  Temp: (!) 97.3 F (36.3 C)  SpO2: 97%   Filed  Weights   02/21/23 1056  Weight: (!) 311 lb (141.1 kg)    GENERAL:alert, no distress and comfortable SKIN: skin color, texture, turgor are normal, no rashes or significant lesions EYES: normal, conjunctiva are pink and non-injected, sclera clear OROPHARYNX:no exudate, no erythema and lips, buccal mucosa, and tongue normal  NECK: supple, thyroid normal size, non-tender, without nodularity LYMPH:  no palpable lymphadenopathy in the cervical, axillary or inguinal LUNGS: clear to auscultation and percussion with normal breathing effort HEART: regular rate & rhythm and no murmurs and no lower extremity edema ABDOMEN:abdomen soft, non-tender and normal bowel sounds Musculoskeletal:no cyanosis of digits and no clubbing  PSYCH: alert & oriented x 3 with fluent speech NEURO: no focal motor/sensory deficits  LABORATORY DATA:  I have reviewed the data as listed Lab Results  Component Value Date   WBC 5.2 02/21/2023   HGB 11.5 (L) 02/21/2023   HCT 36.5 02/21/2023   MCV 83.7 02/21/2023   PLT 321 02/21/2023   Recent Labs    08/04/22 1136  NA 141  K 4.3  CL 103  CO2 28  GLUCOSE 83  BUN 12  CREATININE 0.59  CALCIUM 8.9  PROT 7.4  ALBUMIN 4.0  AST 14  ALT 13  ALKPHOS 92  BILITOT 0.9  BILIDIR 0.2    RADIOGRAPHIC STUDIES: I have personally reviewed the radiological images as listed and agreed with the findings in the report. No results found.

## 2023-02-22 ENCOUNTER — Encounter: Payer: Self-pay | Admitting: Internal Medicine

## 2023-02-22 DIAGNOSIS — M5432 Sciatica, left side: Secondary | ICD-10-CM

## 2023-02-23 NOTE — Telephone Encounter (Signed)
I do not mind refilling, but need a little more information.  Is she having increased issues, pain, etc?  How often taking now.

## 2023-02-23 NOTE — Telephone Encounter (Signed)
Pt requesting refill of Flexeril,  Lov: 12/10/22  Nov:03/16/23  Disp       Refills Start    5 MG tablet 30 tablet 0 06/25/2022

## 2023-02-25 MED ORDER — CYCLOBENZAPRINE HCL 5 MG PO TABS
5.0000 mg | ORAL_TABLET | Freq: Every evening | ORAL | 0 refills | Status: DC | PRN
Start: 2023-02-25 — End: 2023-08-14

## 2023-02-25 NOTE — Telephone Encounter (Signed)
Please call - rx sent in for flexeril.  Notify pt and confirm doing ok with back and tooth.  If any acute change will need to be evaluated.

## 2023-02-25 NOTE — Telephone Encounter (Signed)
Spoke with pt and she stated that she is doing fine. She saw the dentist on Wednesday and was prescribed an abx. She goes to see the chiropractor next week.

## 2023-03-16 ENCOUNTER — Encounter: Payer: Self-pay | Admitting: Internal Medicine

## 2023-03-16 ENCOUNTER — Ambulatory Visit (INDEPENDENT_AMBULATORY_CARE_PROVIDER_SITE_OTHER): Payer: PRIVATE HEALTH INSURANCE | Admitting: Internal Medicine

## 2023-03-16 VITALS — BP 120/74 | HR 88 | Temp 98.2°F | Resp 16 | Ht 59.0 in | Wt 312.0 lb

## 2023-03-16 DIAGNOSIS — R319 Hematuria, unspecified: Secondary | ICD-10-CM

## 2023-03-16 DIAGNOSIS — Z13220 Encounter for screening for lipoid disorders: Secondary | ICD-10-CM | POA: Diagnosis not present

## 2023-03-16 DIAGNOSIS — R200 Anesthesia of skin: Secondary | ICD-10-CM | POA: Diagnosis not present

## 2023-03-16 DIAGNOSIS — C50912 Malignant neoplasm of unspecified site of left female breast: Secondary | ICD-10-CM

## 2023-03-16 DIAGNOSIS — E236 Other disorders of pituitary gland: Secondary | ICD-10-CM

## 2023-03-16 DIAGNOSIS — F439 Reaction to severe stress, unspecified: Secondary | ICD-10-CM

## 2023-03-16 DIAGNOSIS — Z1211 Encounter for screening for malignant neoplasm of colon: Secondary | ICD-10-CM

## 2023-03-16 DIAGNOSIS — Z0001 Encounter for general adult medical examination with abnormal findings: Secondary | ICD-10-CM

## 2023-03-16 DIAGNOSIS — Z0000 Encounter for general adult medical examination without abnormal findings: Secondary | ICD-10-CM

## 2023-03-16 DIAGNOSIS — K219 Gastro-esophageal reflux disease without esophagitis: Secondary | ICD-10-CM

## 2023-03-16 DIAGNOSIS — D508 Other iron deficiency anemias: Secondary | ICD-10-CM

## 2023-03-16 DIAGNOSIS — N949 Unspecified condition associated with female genital organs and menstrual cycle: Secondary | ICD-10-CM

## 2023-03-16 DIAGNOSIS — F419 Anxiety disorder, unspecified: Secondary | ICD-10-CM

## 2023-03-16 LAB — BASIC METABOLIC PANEL WITH GFR
BUN: 10 mg/dL (ref 6–23)
CO2: 30 meq/L (ref 19–32)
Calcium: 9.5 mg/dL (ref 8.4–10.5)
Chloride: 102 meq/L (ref 96–112)
Creatinine, Ser: 0.71 mg/dL (ref 0.40–1.20)
GFR: 101.35 mL/min (ref >60.00)
Glucose, Bld: 89 mg/dL (ref 70–99)
Potassium: 4.3 meq/L (ref 3.5–5.1)
Sodium: 136 meq/L (ref 135–145)

## 2023-03-16 LAB — HEPATIC FUNCTION PANEL
ALT: 13 U/L (ref 0–35)
AST: 13 U/L (ref 0–37)
Albumin: 4 g/dL (ref 3.5–5.2)
Alkaline Phosphatase: 102 U/L (ref 39–117)
Bilirubin, Direct: 0.2 mg/dL (ref 0.0–0.3)
Total Bilirubin: 1.2 mg/dL (ref 0.2–1.2)
Total Protein: 7.8 g/dL (ref 6.0–8.3)

## 2023-03-16 LAB — LIPID PANEL
Cholesterol: 142 mg/dL (ref 0–200)
HDL: 49.3 mg/dL (ref >39.00)
LDL Cholesterol: 80 mg/dL (ref 0–99)
NonHDL: 92.56
Total CHOL/HDL Ratio: 3
Triglycerides: 63 mg/dL (ref 0.0–149.0)
VLDL: 12.6 mg/dL (ref 0.0–40.0)

## 2023-03-16 LAB — VITAMIN B12: Vitamin B-12: 833 pg/mL (ref 211–911)

## 2023-03-16 LAB — TSH: TSH: 1.33 u[IU]/mL (ref 0.35–5.50)

## 2023-03-16 NOTE — Assessment & Plan Note (Signed)
Appears to be doing well on lexapro.  Follow.  ?

## 2023-03-16 NOTE — Assessment & Plan Note (Signed)
Completed tamoxifen (5 years).  May 2024 - mammogram ok.  F/u mammogram 10/2023.

## 2023-03-16 NOTE — Assessment & Plan Note (Signed)
Seeing oncology. F/u 01/2023 - continue oral iron.  Follow up cbc and iron studies.

## 2023-03-16 NOTE — Assessment & Plan Note (Signed)
Reported numbness - foot (lower leg) - when sits for long periods in her work chair.  Up and walking - resolved.  Check routine labs and B12.  Has appt with neurology. Plans to discuss - question of need for NCS.

## 2023-03-16 NOTE — Assessment & Plan Note (Signed)
Physical today 12/14/22.  Mammogram 11/05/22 - Birads II.  Scheduled for appt with GI to discussed colonoscopy.  Discussed the need to follow up with gyn for f/u pap.

## 2023-03-16 NOTE — Assessment & Plan Note (Signed)
Appt scheduled with GI in 03/2023.

## 2023-03-16 NOTE — Assessment & Plan Note (Signed)
PPI daily.  Follow. GI referral has been placed.  History of IDA and need for PPI.  Evaluate for question of need for EGD.

## 2023-03-16 NOTE — Assessment & Plan Note (Signed)
Had follow up CT neck - no acute process in the neck. Partial empty sella and tortuous optic nerves, which are nonspecific but can be seen in the setting of idiopathic intracranial hypertension.discussed CT findings with neurology.  Appt has been scheduled.

## 2023-03-16 NOTE — Assessment & Plan Note (Signed)
Continue lexapro.  Overall appears to be stable.  Follow.

## 2023-03-16 NOTE — Assessment & Plan Note (Signed)
Had noted left adnexal cyst on previous MRI 07/2018.  Saw gyn.  Had f/u pelvic ultrasound 08/2018.  Continued to f/u with gyn throughout her pregnancy in 2020.  She is planning to schedule a f/u with gyn - Dr Feliberto Gottron. Needs f/u scheduled. Discussed again today.

## 2023-03-16 NOTE — Assessment & Plan Note (Signed)
Needs a f/u urine to confirm blood clear.

## 2023-03-16 NOTE — Progress Notes (Signed)
Subjective:    Patient ID: Brittney Nash, female    DOB: [birth year], 47 y.o.   MRN: [id number]  Patient here for  Chief Complaint  Patient presents with   Annual Exam    HPI Here for a physical.  She reports she is doing well.  Feels good.  Work is going well.  No increased stress.  No chest pain or sob reported.  No abdominal pain or bowel change reported. She is completing a round of abx currently for a tooth infection.  Last day tomorrow.  Tooth is better. Seeing oncology for f/u breast cancer and anemia.  Taking iron. Has appt with GI - 03/2023.  Has appt with Dr Sherryll Burger - f/u regarding partial empty sella and tortuous optic nerves.  No headache.  No dizziness.  Does report if she sits too long in her work chair, will notice mid lower leg to foot - numbness. As soon as she gets up and walks around, numbness resolves.  No unsteady gait.    Past Medical History:  Diagnosis Date   Arthritis 2013   Breast cancer (HCC) 08/26/2016   History of femur fracture 2004   s/p surgery with rod placement   Past Surgical History:  Procedure Laterality Date   BREAST LUMPECTOMY Left 11/02/2016   LEG SURGERY Left    Family History  Problem Relation Age of Onset   Arthritis Mother    Heart disease Paternal Grandfather    Social History   Socioeconomic History   Marital status: Single    Spouse name: Not on file   Number of children: Not on file   Years of education: Not on file   Highest education level: Not on file  Occupational History   Not on file  Tobacco Use   Smoking status: Never   Smokeless tobacco: Never  Vaping Use   Vaping status: Never Used  Substance and Sexual Activity   Alcohol use: Yes    Comment: occ 1-2 times a month    Drug use: No   Sexual activity: Not Currently    Birth control/protection: None  Other Topics Concern   Not on file  Social History Narrative   Not on file   Social Determinants of Health   Financial Resource Strain: Not on file  Food  Insecurity: Not on file  Transportation Needs: Not on file  Physical Activity: Not on file  Stress: Not on file  Social Connections: Not on file     Review of Systems  Constitutional:  Negative for appetite change and unexpected weight change.  HENT:  Negative for congestion, sinus pressure and sore throat.   Eyes:  Negative for pain and visual disturbance.  Respiratory:  Negative for cough, chest tightness and shortness of breath.   Cardiovascular:  Negative for chest pain and palpitations.  Gastrointestinal:  Negative for abdominal pain, diarrhea, nausea and vomiting.  Genitourinary:  Negative for difficulty urinating and dysuria.  Musculoskeletal:  Negative for joint swelling and myalgias.  Skin:  Negative for color change and rash.  Neurological:  Negative for dizziness and headaches.  Hematological:  Negative for adenopathy. Does not bruise/bleed easily.  Psychiatric/Behavioral:  Negative for agitation and dysphoric mood.        Objective:     BP 120/74   Pulse 88   Temp 98.2 F (36.8 C)   Resp 16   Ht 4\' 11"  (1.499 m)   Wt (!) 312 lb (141.5 kg)   SpO2 98%  BMI 63.02 kg/m  Wt Readings from Last 3 Encounters:  03/16/23 (!) 312 lb (141.5 kg)  02/21/23 (!) 311 lb (141.1 kg)  02/12/23 (!) 309 lb 11.9 oz (140.5 kg)    Physical Exam Vitals reviewed.  Constitutional:      General: She is not in acute distress.    Appearance: Normal appearance.  HENT:     Head: Normocephalic and atraumatic.     Right Ear: External ear normal.     Left Ear: External ear normal.  Eyes:     General: No scleral icterus.       Right eye: No discharge.        Left eye: No discharge.     Conjunctiva/sclera: Conjunctivae normal.  Neck:     Thyroid: No thyromegaly.  Cardiovascular:     Rate and Rhythm: Normal rate and regular rhythm.  Pulmonary:     Effort: No respiratory distress.     Breath sounds: Normal breath sounds. No wheezing.     Comments: Breasts:  through oncology.   Abdominal:     General: Bowel sounds are normal.     Palpations: Abdomen is soft.     Tenderness: There is no abdominal tenderness.  Genitourinary:    Comments: Per gyn.  Musculoskeletal:        General: No swelling or tenderness.     Cervical back: Neck supple. No tenderness.  Lymphadenopathy:     Cervical: No cervical adenopathy.  Skin:    Findings: No erythema or rash.  Neurological:     Mental Status: She is alert.  Psychiatric:        Mood and Affect: Mood normal.        Behavior: Behavior normal.      Outpatient Encounter Medications as of 03/16/2023  Medication Sig   cyanocobalamin 1000 MCG tablet Take 1,000 mcg by mouth daily.   cyclobenzaprine (FLEXERIL) 5 MG tablet Take 1 tablet (5 mg total) by mouth at bedtime as needed for muscle spasms.   escitalopram (LEXAPRO) 20 MG tablet Take 1 tablet (20 mg total) by mouth daily.   ferrous sulfate 325 (65 FE) MG tablet Take 325 mg by mouth daily with breakfast.   No facility-administered encounter medications on file as of 03/16/2023.     Lab Results  Component Value Date   WBC 5.2 02/21/2023   HGB 11.5 (L) 02/21/2023   HCT 36.5 02/21/2023   PLT 321 02/21/2023   GLUCOSE 89 03/16/2023   CHOL 142 03/16/2023   TRIG 63.0 03/16/2023   HDL 49.30 03/16/2023   LDLCALC 80 03/16/2023   ALT 13 03/16/2023   AST 13 03/16/2023   NA 136 03/16/2023   K 4.3 03/16/2023   CL 102 03/16/2023   CREATININE 0.71 03/16/2023   BUN 10 03/16/2023   CO2 30 03/16/2023   TSH 1.33 03/16/2023   INR 1.1 09/02/2011       Assessment & Plan:  Routine general medical examination at a health care facility  Numbness of left foot Assessment & Plan: Reported numbness - foot (lower leg) - when sits for long periods in her work chair.  Up and walking - resolved.  Check routine labs and B12.  Has appt with neurology. Plans to discuss - question of need for NCS.   Orders: -     Basic metabolic panel -     Hepatic function panel -     TSH -      Vitamin B12  Screening cholesterol level -  Lipid panel  Adnexal cyst Assessment & Plan: Had noted left adnexal cyst on previous MRI 07/2018.  Saw gyn.  Had f/u pelvic ultrasound 08/2018.  Continued to f/u with gyn throughout her pregnancy in 2020.  She is planning to schedule a f/u with gyn - Dr Feliberto Gottron. Needs f/u scheduled. Discussed again today.    Anxiety Assessment & Plan: Continue lexapro.  Overall appears to be stable.  Follow.    Malignant neoplasm of left female breast, unspecified estrogen receptor status, unspecified site of breast [facility]) Assessment & Plan: Completed tamoxifen (5 years).  May 2024 - mammogram ok.  F/u mammogram 10/2023.    Colon cancer screening Assessment & Plan: Appt scheduled with GI in 03/2023.    Empty sella (HCC) Assessment & Plan: Had follow up CT neck - no acute process in the neck. Partial empty sella and tortuous optic nerves, which are nonspecific but can be seen in the setting of idiopathic intracranial hypertension.discussed CT findings with neurology.  Appt has been scheduled.    Gastroesophageal reflux disease, unspecified whether esophagitis present Assessment & Plan: PPI daily.  Follow. GI referral has been placed.  History of IDA and need for PPI.  Evaluate for question of need for EGD.    Healthcare maintenance Assessment & Plan: Physical today 12/14/22.  Mammogram 11/05/22 - Birads II.  Scheduled for appt with GI to discussed colonoscopy.  Discussed the need to follow up with gyn for f/u pap.    Hematuria, unspecified type Assessment & Plan: Needs a f/u urine to confirm blood clear.    Other iron deficiency anemia Assessment & Plan: Seeing oncology. F/u 01/2023 - continue oral iron.  Follow up cbc and iron studies.    Stress Assessment & Plan: Appears to be doing well on lexapro.  Follow.       Dale Frankfort, MD

## 2023-04-19 DIAGNOSIS — Z1211 Encounter for screening for malignant neoplasm of colon: Secondary | ICD-10-CM | POA: Diagnosis not present

## 2023-04-19 DIAGNOSIS — D509 Iron deficiency anemia, unspecified: Secondary | ICD-10-CM | POA: Diagnosis not present

## 2023-04-19 DIAGNOSIS — K219 Gastro-esophageal reflux disease without esophagitis: Secondary | ICD-10-CM | POA: Diagnosis not present

## 2023-04-27 ENCOUNTER — Encounter: Payer: Self-pay | Admitting: Gastroenterology

## 2023-04-29 ENCOUNTER — Other Ambulatory Visit: Payer: Self-pay | Admitting: Internal Medicine

## 2023-05-09 DIAGNOSIS — E236 Other disorders of pituitary gland: Secondary | ICD-10-CM | POA: Diagnosis not present

## 2023-05-09 DIAGNOSIS — M25562 Pain in left knee: Secondary | ICD-10-CM | POA: Diagnosis not present

## 2023-05-15 ENCOUNTER — Encounter: Payer: Self-pay | Admitting: Internal Medicine

## 2023-05-30 ENCOUNTER — Ambulatory Visit: Payer: PRIVATE HEALTH INSURANCE | Admitting: Internal Medicine

## 2023-06-17 ENCOUNTER — Encounter: Payer: Self-pay | Admitting: Gastroenterology

## 2023-06-30 ENCOUNTER — Encounter: Payer: Self-pay | Admitting: Gastroenterology

## 2023-07-11 ENCOUNTER — Encounter: Payer: Self-pay | Admitting: Gastroenterology

## 2023-07-11 ENCOUNTER — Ambulatory Visit
Admission: RE | Admit: 2023-07-11 | Discharge: 2023-07-11 | Disposition: A | Discharge status: Home / Self Care | Payer: BC Managed Care – PPO | Attending: Gastroenterology | Admitting: Gastroenterology

## 2023-07-11 ENCOUNTER — Encounter
Admission: RE | Disposition: A | Discharge status: Home / Self Care | Payer: Self-pay | Source: Home / Self Care | Attending: Gastroenterology

## 2023-07-11 ENCOUNTER — Ambulatory Visit: Payer: BC Managed Care – PPO | Admitting: Certified Registered"

## 2023-07-11 DIAGNOSIS — Z6841 Body Mass Index (BMI) 40.0 and over, adult: Secondary | ICD-10-CM | POA: Diagnosis not present

## 2023-07-11 DIAGNOSIS — E66813 Obesity, class 3: Secondary | ICD-10-CM | POA: Insufficient documentation

## 2023-07-11 DIAGNOSIS — K219 Gastro-esophageal reflux disease without esophagitis: Secondary | ICD-10-CM | POA: Diagnosis not present

## 2023-07-11 DIAGNOSIS — K2970 Gastritis, unspecified, without bleeding: Secondary | ICD-10-CM | POA: Diagnosis not present

## 2023-07-11 DIAGNOSIS — Z853 Personal history of malignant neoplasm of breast: Secondary | ICD-10-CM | POA: Diagnosis not present

## 2023-07-11 DIAGNOSIS — K2950 Unspecified chronic gastritis without bleeding: Secondary | ICD-10-CM | POA: Diagnosis not present

## 2023-07-11 DIAGNOSIS — K635 Polyp of colon: Secondary | ICD-10-CM | POA: Insufficient documentation

## 2023-07-11 DIAGNOSIS — K2960 Other gastritis without bleeding: Secondary | ICD-10-CM | POA: Diagnosis not present

## 2023-07-11 DIAGNOSIS — D509 Iron deficiency anemia, unspecified: Secondary | ICD-10-CM | POA: Insufficient documentation

## 2023-07-11 DIAGNOSIS — F419 Anxiety disorder, unspecified: Secondary | ICD-10-CM | POA: Diagnosis not present

## 2023-07-11 HISTORY — PX: COLONOSCOPY WITH PROPOFOL: SHX5780

## 2023-07-11 HISTORY — PX: ESOPHAGOGASTRODUODENOSCOPY (EGD) WITH PROPOFOL: SHX5813

## 2023-07-11 HISTORY — PX: BIOPSY: SHX5522

## 2023-07-11 HISTORY — PX: POLYPECTOMY: SHX5525

## 2023-07-11 LAB — POCT PREGNANCY, URINE: Preg Test, Ur: NEGATIVE

## 2023-07-11 SURGERY — COLONOSCOPY WITH PROPOFOL
Anesthesia: General

## 2023-07-11 MED ORDER — PROPOFOL 500 MG/50ML IV EMUL
INTRAVENOUS | Status: DC | PRN
  Administered 2023-07-11: 20 mg via INTRAVENOUS
  Administered 2023-07-11: 100 ug/kg/min via INTRAVENOUS
  Administered 2023-07-11: 40 mg via INTRAVENOUS
  Administered 2023-07-11: 120 mg via INTRAVENOUS
  Administered 2023-07-11: 40 mg via INTRAVENOUS

## 2023-07-11 MED ORDER — PROPOFOL 10 MG/ML IV BOLUS
INTRAVENOUS | Status: AC
  Filled 2023-07-11: qty 40

## 2023-07-11 MED ORDER — SODIUM CHLORIDE 0.9 % IV SOLN
INTRAVENOUS | Status: DC
  Administered 2023-07-11: 20 mL/h via INTRAVENOUS

## 2023-07-11 MED ORDER — LIDOCAINE HCL (PF) 2 % IJ SOLN
INTRAMUSCULAR | Status: AC
  Filled 2023-07-11: qty 5

## 2023-07-11 MED ORDER — LIDOCAINE HCL (CARDIAC) PF 100 MG/5ML IV SOSY
PREFILLED_SYRINGE | INTRAVENOUS | Status: DC | PRN
  Administered 2023-07-11: 100 mg via INTRAVENOUS

## 2023-07-11 NOTE — Op Note (Addendum)
 [facility] Patient Name: Brittney Nash Procedure Date: 07/11/2023 10:56 AM MRN: [id number] Account #: [id number] Date of Birth: 11-25-1975 Admit Type: Outpatient Age: 47 Room: [facility] ENDO ROOM 2 Gender: Female Note Status: Supervisor Override Instrument Name: Barnie Endoscope [id number] Procedure:             Upper GI endoscopy Indications:           Iron deficiency anemia, Gastro-esophageal reflux                         disease Providers:             Elspeth Ozell Jungling DO, DO Medicines:             Monitored Anesthesia Care Complications:         No immediate complications. Estimated blood loss:                         Minimal. Procedure:             Pre-Anesthesia Assessment:                        - Prior to the procedure, a History and Physical was                         performed, and patient medications and allergies were                         reviewed. The patient is competent. The risks and                         benefits of the procedure and the sedation options and                         risks were discussed with the patient. All questions                         were answered and informed consent was obtained.                         Patient identification and proposed procedure were                         verified by the physician, the nurse, the anesthetist                         and the technician in the endoscopy suite. Mental                         Status Examination: alert and oriented. Airway                         Examination: normal oropharyngeal airway and neck                         mobility. Respiratory Examination: clear to                         auscultation. CV Examination: RRR, no murmurs, no S3  or S4. Prophylactic Antibiotics: The patient does not                         require prophylactic antibiotics. Prior                         Anticoagulants: The patient has taken no anticoagulant                          or antiplatelet agents. ASA Grade Assessment: III - A                         patient with severe systemic disease. After reviewing                         the risks and benefits, the patient was deemed in                         satisfactory condition to undergo the procedure. The                         anesthesia plan was to use monitored anesthesia care                         (MAC). Immediately prior to administration of                         medications, the patient was re-assessed for adequacy                         to receive sedatives. The heart rate, respiratory                         rate, oxygen saturations, blood pressure, adequacy of                         pulmonary ventilation, and response to care were                         monitored throughout the procedure. The physical                         status of the patient was re-assessed after the                         procedure.                        After obtaining informed consent, the endoscope was                         passed under direct vision. Throughout the procedure,                         the patient's blood pressure, pulse, and oxygen                         saturations were monitored continuously. The Endoscope  was introduced through the mouth, and advanced to the                         second part of duodenum. The upper GI endoscopy was                         accomplished without difficulty. The patient tolerated                         the procedure well. Findings:      The duodenal bulb, first portion of the duodenum and second portion of       the duodenum were normal. Biopsies for histology were taken with a cold       forceps for evaluation of celiac disease. Estimated blood loss was       minimal.      Localized mild inflammation characterized by erythema was found in the       gastric antrum. Biopsies were taken with a cold forceps for  Helicobacter       pylori testing. biopsies from antrum and body Estimated blood loss was       minimal.      Localized moderate mucosal changes characterized by granularity were       found on the greater curvature of the gastric body. Biopsies were taken       with a cold forceps for histology. Estimated blood loss was minimal.       Imaging was performed using white light and narrow band imaging to       visualize the mucosa. Estimated blood loss: none.      The exam of the stomach was otherwise normal.      The Z-line was regular. Estimated blood loss: none.      Esophagogastric landmarks were identified: the gastroesophageal junction       was found at 35 cm from the incisors.      The exam of the esophagus was otherwise normal. Impression:            - Normal duodenal bulb, first portion of the duodenum                         and second portion of the duodenum. Biopsied.                        - Gastritis. Biopsied.                        - Granular mucosa in the greater curvature of the                         gastric body. Biopsied.                        - Z-line regular.                        - Esophagogastric landmarks identified. Recommendation:        - Patient has a contact number available for                         emergencies. The signs and symptoms of potential  delayed complications were discussed with the patient.                         Return to normal activities tomorrow. Written                         discharge instructions were provided to the patient.                        - Discharge patient to home.                        - Resume previous diet.                        - Continue present medications.                        - Await pathology results.                        - Return to GI clinic as previously scheduled.                        - proceed with colonoscopy. see report for further                          recommendations.                        - The findings and recommendations were discussed with                         the patient. Procedure Code(s):     --- Professional ---                        [phone], Esophagogastroduodenoscopy, flexible,                         transoral; with biopsy, single or multiple Diagnosis Code(s):     --- Professional ---                        K29.70, Gastritis, unspecified, without bleeding                        K31.89, Other diseases of stomach and duodenum                        D50.9, Iron deficiency anemia, unspecified CPT copyright 2022 American [employer] Association. All rights reserved. The codes documented in this report are preliminary and upon coder review may  be revised to meet current compliance requirements. Attending Participation:      I personally performed the entire procedure. Elspeth Jungling, DO Elspeth Ozell Jungling DO, DO 07/11/2023 12:40:20 PM This report has been signed electronically. Number of Addenda: 0 Note Initiated On: 07/11/2023 10:56 AM Estimated Blood Loss:  Estimated blood loss was minimal.      [facility]

## 2023-07-11 NOTE — Transfer of Care (Signed)
 Immediate Anesthesia Transfer of Care Note  Patient: Brittney Nash  Procedure(s) Performed: COLONOSCOPY WITH PROPOFOL  ESOPHAGOGASTRODUODENOSCOPY (EGD) WITH PROPOFOL  BIOPSY POLYPECTOMY  Patient Location: Endoscopy Unit  Anesthesia Type:General  Level of Consciousness: drowsy  Airway & Oxygen Therapy: Patient Spontanous Breathing and Patient connected to face mask oxygen  Post-op Assessment: Report given to RN and Post -op Vital signs reviewed and stable  Post vital signs: Reviewed and stable  Last Vitals:  Vitals Value Taken Time  BP 107/66 07/11/23 1302  Temp 35.7 C 07/11/23 1302  Pulse 82 07/11/23 1302  Resp 21 07/11/23 1302  SpO2 100 % 07/11/23 1302    Last Pain:  Vitals:   07/11/23 1302  TempSrc: Temporal  PainSc: Asleep         Complications: No notable events documented.

## 2023-07-11 NOTE — Anesthesia Preprocedure Evaluation (Signed)
 Anesthesia Evaluation  Patient identified by MRN, date of birth, ID band Patient awake    Reviewed: Allergy & Precautions, H&P , NPO status , Patient's Chart, lab work & pertinent test results, reviewed documented beta blocker date and time   Airway Mallampati: II   Neck ROM: full    Dental  (+) Poor Dentition   Pulmonary neg pulmonary ROS   Pulmonary exam normal        Cardiovascular Exercise Tolerance: Good negative cardio ROS Normal cardiovascular exam Rhythm:regular Rate:Normal     Neuro/Psych   Anxiety      Neuromuscular disease  negative psych ROS   GI/Hepatic negative GI ROS, Neg liver ROS,GERD  Medicated,,  Endo/Other    Class 3 obesity  Renal/GU negative Renal ROS  negative genitourinary   Musculoskeletal   Abdominal   Peds  Hematology negative hematology ROS (+) Blood dyscrasia, anemia   Anesthesia Other Findings Past Medical History: 2013: Arthritis 08/26/2016: Breast cancer (HCC) 2004: History of femur fracture     Comment:  s/p surgery with rod placement Past Surgical History: 11/02/2016: BREAST LUMPECTOMY; Left No date: LEG SURGERY; Left BMI    Body Mass Index: 63.38 kg/m     Reproductive/Obstetrics negative OB ROS                             Anesthesia Physical Anesthesia Plan  ASA: 3  Anesthesia Plan: General   Post-op Pain Management:    Induction:   PONV Risk Score and Plan:   Airway Management Planned:   Additional Equipment:   Intra-op Plan:   Post-operative Plan:   Informed Consent: I have reviewed the patients History and Physical, chart, labs and discussed the procedure including the risks, benefits and alternatives for the proposed anesthesia with the patient or authorized representative who has indicated his/her understanding and acceptance.     Dental Advisory Given  Plan Discussed with: CRNA  Anesthesia Plan Comments:         Anesthesia Quick Evaluation

## 2023-07-11 NOTE — Op Note (Signed)
 [facility] Patient Name: Brittney Nash Procedure Date: 07/11/2023 10:56 AM MRN: [id number] Account #: [id number] Date of Birth: Jul 15, 1975 Admit Type: Outpatient Age: 48 Room: [facility] ENDO ROOM 2 Gender: Female Note Status: Finalized Instrument Name: Colonscope [id number] Procedure:             Colonoscopy Indications:           Iron deficiency anemia Providers:             Elspeth Ozell Jungling DO, DO Medicines:             Monitored Anesthesia Care Complications:         No immediate complications. Estimated blood loss:                         Minimal. Procedure:             Pre-Anesthesia Assessment:                        - Prior to the procedure, a History and Physical was                         performed, and patient medications and allergies were                         reviewed. The patient is competent. The risks and                         benefits of the procedure and the sedation options and                         risks were discussed with the patient. All questions                         were answered and informed consent was obtained.                         Patient identification and proposed procedure were                         verified by the physician, the nurse, the anesthetist                         and the technician in the endoscopy suite. Mental                         Status Examination: alert and oriented. Airway                         Examination: normal oropharyngeal airway and neck                         mobility. Respiratory Examination: clear to                         auscultation. CV Examination: RRR, no murmurs, no S3                         or S4. Prophylactic Antibiotics: The patient does not  require prophylactic antibiotics. Prior                         Anticoagulants: The patient has taken no anticoagulant                         or antiplatelet agents. ASA Grade Assessment: III - A                          patient with severe systemic disease. After reviewing                         the risks and benefits, the patient was deemed in                         satisfactory condition to undergo the procedure. The                         anesthesia plan was to use monitored anesthesia care                         (MAC). Immediately prior to administration of                         medications, the patient was re-assessed for adequacy                         to receive sedatives. The heart rate, respiratory                         rate, oxygen saturations, blood pressure, adequacy of                         pulmonary ventilation, and response to care were                         monitored throughout the procedure. The physical                         status of the patient was re-assessed after the                         procedure.                        After obtaining informed consent, the colonoscope was                         passed under direct vision. Throughout the procedure,                         the patient's blood pressure, pulse, and oxygen                         saturations were monitored continuously. The                         Colonoscope was introduced through the anus and  advanced to the the cecum, identified by appendiceal                         orifice and ileocecal valve. The colonoscopy was                         performed without difficulty. The patient tolerated                         the procedure well. The quality of the bowel                         preparation was evaluated using the BBPS [facility] Bowel                         Preparation Scale) with scores of: Right Colon = 2                         (minor amount of residual staining, small fragments of                         stool and/or opaque liquid, but mucosa seen well),                         Transverse Colon = 3 (entire mucosa seen well with no                          residual staining, small fragments of stool or opaque                         liquid) and Left Colon = 3 (entire mucosa seen well                         with no residual staining, small fragments of stool or                         opaque liquid). The total BBPS score equals 8. The                         quality of the bowel preparation was excellent. The                         ileocecal valve, appendiceal orifice, and rectum were                         photographed. Findings:      The perianal exam findings include blood noted from reported vaginal       bleeding (pt menstruating per report).      The digital rectal exam was normal. Pertinent negatives include normal       sphincter tone.      Two sessile polyps were found in the descending colon. The polyps were 3       to 4 mm in size. These polyps were removed with a cold snare. Resection       and retrieval were complete. Estimated blood loss was minimal.      The exam was otherwise without abnormality on  direct and retroflexion       views. Impression:            - Blood noted from reported vaginal bleeding (pt                         menstruating per report) found on perianal exam.                        - Two 3 to 4 mm polyps in the descending colon,                         removed with a cold snare. Resected and retrieved.                        - The examination was otherwise normal on direct and                         retroflexion views. Recommendation:        - Patient has a contact number available for                         emergencies. The signs and symptoms of potential                         delayed complications were discussed with the patient.                         Return to normal activities tomorrow. Written                         discharge instructions were provided to the patient.                        - Discharge patient to home.                        - Resume previous diet.                         - Continue present medications.                        - No ibuprofen , naproxen , or other non-steroidal                         anti-inflammatory drugs for 5 days after polyp removal.                        - Await pathology results.                        - Repeat colonoscopy for surveillance based on                         pathology results.                        - Return to referring physician as previously  scheduled.                        - Suspect iron deficiency from gynecological origin.                        Recommend evaluation for sleep apnea as outpatient                        - The findings and recommendations were discussed with                         the patient. Procedure Code(s):     --- Professional ---                        [phone], Colonoscopy, flexible; with removal of                         tumor(s), polyp(s), or other lesion(s) by snare                         technique Diagnosis Code(s):     --- Professional ---                        D12.4, Benign neoplasm of descending colon                        D50.9, Iron deficiency anemia, unspecified CPT copyright 2022 American Medical Association. All rights reserved. The codes documented in this report are preliminary and upon coder review may  be revised to meet current compliance requirements. Attending Participation:      I personally performed the entire procedure. Elspeth Jungling, DO Elspeth Ozell Jungling DO, DO 07/11/2023 1:02:26 PM This report has been signed electronically. Number of Addenda: 0 Note Initiated On: 07/11/2023 10:56 AM Scope Withdrawal Time: 0 hours 9 minutes 38 seconds  Total Procedure Duration: 0 hours 13 minutes 45 seconds  Estimated Blood Loss:  Estimated blood loss was minimal.      [facility]

## 2023-07-11 NOTE — Interval H&P Note (Signed)
 History and Physical Interval Note: Preprocedure H&P from 07/11/23  was reviewed and there was no interval change after seeing and examining the patient.  Written consent was obtained from the patient after discussion of risks, benefits, and alternatives. Patient has consented to proceed with Esophagogastroduodenoscopy and Colonoscopy with possible intervention   07/11/2023 12:16 PM  Brittney Nash  has presented today for surgery, with the diagnosis of D50.9 (ICD-10-CM) - Iron deficiency anemia, unspecified iron deficiency anemia type K21.9 (ICD-10-CM) - Gastroesophageal reflux disease, unspecified whether esophagitis present.  The various methods of treatment have been discussed with the patient and family. After consideration of risks, benefits and other options for treatment, the patient has consented to  Procedure(s): COLONOSCOPY WITH PROPOFOL  (N/A) ESOPHAGOGASTRODUODENOSCOPY (EGD) WITH PROPOFOL  (N/A) as a surgical intervention.  The patient's history has been reviewed, patient examined, no change in status, stable for surgery.  I have reviewed the patient's chart and labs.  Questions were answered to the patient's satisfaction.     Elspeth Ozell Jungling

## 2023-07-11 NOTE — H&P (Signed)
 Pre-Procedure H&P   Patient ID: Brittney Nash is a 48 y.o. female.  Gastroenterology Provider: Elspeth Ozell Jungling, DO  Referring Provider: Jonette Primmer, PA PCP: Glendia Shad, MD  Date: 07/11/2023  HPI Brittney Nash is a 48 y.o. female who presents today for Esophagogastroduodenoscopy and Colonoscopy for Iron deficiency anemia .  Patient has had longstanding iron deficiency for approximately 5 years plus according to chart review.  She has not previously undergone upper or lower endoscopy.  Was initially noted to have a hemoglobin 11.5 with MCV 79 iron saturation 7.5 ferritin 8 TIBC 517.  With iron supplementation hemoglobin remained 11.5 MCV improved to 83.7 ferritin 25 TIBC 414 saturation 12%  Patient denies any melena or hematochezia.  She has approximately 2 bowel months per day.  No upper GI symptoms.  She has no family history of colon cancer or colon polyps   Past Medical History:  Diagnosis Date   Arthritis 2013   Breast cancer (HCC) 08/26/2016   History of femur fracture 2004   s/p surgery with rod placement    Past Surgical History:  Procedure Laterality Date   BREAST LUMPECTOMY Left 11/02/2016   LEG SURGERY Left     Family History No h/o GI disease or malignancy  Review of Systems  Constitutional:  Negative for activity change, appetite change, chills, diaphoresis, fatigue, fever and unexpected weight change.  HENT:  Negative for trouble swallowing and voice change.   Respiratory:  Negative for shortness of breath and wheezing.   Cardiovascular:  Negative for chest pain, palpitations and leg swelling.  Gastrointestinal:  Negative for abdominal distention, abdominal pain, anal bleeding, blood in stool, constipation, diarrhea, nausea, rectal pain and vomiting.  Musculoskeletal:  Negative for arthralgias and myalgias.  Skin:  Negative for color change and pallor.  Neurological:  Negative for dizziness, syncope and weakness.  Psychiatric/Behavioral:   Negative for confusion.   All other systems reviewed and are negative.    Medications No current facility-administered medications on file prior to encounter.   Current Outpatient Medications on File Prior to Encounter  Medication Sig Dispense Refill   cyanocobalamin  1000 MCG tablet Take 1,000 mcg by mouth daily.     cyclobenzaprine  (FLEXERIL ) 5 MG tablet Take 1 tablet (5 mg total) by mouth at bedtime as needed for muscle spasms. 30 tablet 0   ferrous sulfate 325 (65 FE) MG tablet Take 325 mg by mouth daily with breakfast.      Pertinent medications related to GI and procedure were reviewed by me with the patient prior to the procedure   Current Facility-Administered Medications:    0.9 %  sodium chloride  infusion, , Intravenous, Continuous, Jungling Elspeth Ozell, DO  sodium chloride          No Known Allergies Allergies were reviewed by me prior to the procedure  Objective   Body mass index is 63.38 kg/m. Vitals:   07/11/23 1214  BP: 125/89  Pulse: 89  Resp: 20  Temp: (!) 96.9 F (36.1 C)  TempSrc: Temporal  SpO2: 97%  Weight: (!) 142.3 kg  Height: 4' 11 (1.499 m)     Physical Exam Vitals and nursing note reviewed.  Constitutional:      General: She is not in acute distress.    Appearance: Normal appearance. She is obese. She is not ill-appearing, toxic-appearing or diaphoretic.  HENT:     Head: Normocephalic and atraumatic.     Nose: Nose normal.     Mouth/Throat:  Mouth: Mucous membranes are moist.     Pharynx: Oropharynx is clear.  Eyes:     General: No scleral icterus.    Extraocular Movements: Extraocular movements intact.  Cardiovascular:     Rate and Rhythm: Normal rate and regular rhythm.     Heart sounds: Normal heart sounds. No murmur heard.    No friction rub. No gallop.  Pulmonary:     Effort: Pulmonary effort is normal. No respiratory distress.     Breath sounds: Normal breath sounds. No wheezing, rhonchi or rales.  Abdominal:      General: Bowel sounds are normal. There is no distension.     Palpations: Abdomen is soft.     Tenderness: There is no abdominal tenderness. There is no guarding or rebound.  Musculoskeletal:     Cervical back: Neck supple.     Right lower leg: No edema.     Left lower leg: No edema.  Skin:    General: Skin is warm and dry.     Coloration: Skin is not jaundiced or pale.  Neurological:     General: No focal deficit present.     Mental Status: She is alert and oriented to person, place, and time. Mental status is at baseline.  Psychiatric:        Mood and Affect: Mood normal.        Behavior: Behavior normal.        Thought Content: Thought content normal.        Judgment: Judgment normal.      Assessment:  Brittney Nash is a 48 y.o. female  who presents today for Esophagogastroduodenoscopy and Colonoscopy for Iron deficiency anemia .  Plan:  Esophagogastroduodenoscopy and Colonoscopy with possible intervention today  Esophagogastroduodenoscopy and Colonoscopy with possible biopsy, control of bleeding, polypectomy, and interventions as necessary has been discussed with the patient/patient representative. Informed consent was obtained from the patient/patient representative after explaining the indication, nature, and risks of the procedure including but not limited to death, bleeding, perforation, missed neoplasm/lesions, cardiorespiratory compromise, and reaction to medications. Opportunity for questions was given and appropriate answers were provided. Patient/patient representative has verbalized understanding is amenable to undergoing the procedure.   Elspeth Ozell Jungling, DO  [facility]  Portions of the record may have been created with voice recognition software. Occasional wrong-word or 'sound-a-like' substitutions may have occurred due to the inherent limitations of voice recognition software.  Read the chart carefully and recognize, using context,  where substitutions may have occurred.

## 2023-07-13 LAB — SURGICAL PATHOLOGY

## 2023-07-13 NOTE — Anesthesia Postprocedure Evaluation (Signed)
 Anesthesia Post Note  Patient: Brittney Nash  Procedure(s) Performed: COLONOSCOPY WITH PROPOFOL  ESOPHAGOGASTRODUODENOSCOPY (EGD) WITH PROPOFOL  BIOPSY POLYPECTOMY  Patient location during evaluation: PACU Anesthesia Type: General Level of consciousness: awake and alert Pain management: pain level controlled Vital Signs Assessment: post-procedure vital signs reviewed and stable Respiratory status: spontaneous breathing, nonlabored ventilation, respiratory function stable and patient connected to nasal cannula oxygen Cardiovascular status: blood pressure returned to baseline and stable Postop Assessment: no apparent nausea or vomiting Anesthetic complications: no   No notable events documented.   Last Vitals:  Vitals:   07/11/23 1312 07/11/23 1322  BP: 105/71 110/81  Pulse:    Resp: 19 20  Temp:    SpO2: 98% 97%    Last Pain:  Vitals:   07/12/23 0750  TempSrc:   PainSc: 0-No pain                 Zula Hitch

## 2023-07-18 ENCOUNTER — Encounter: Payer: Self-pay | Admitting: Internal Medicine

## 2023-07-18 ENCOUNTER — Telehealth: Payer: Self-pay

## 2023-07-18 ENCOUNTER — Telehealth: Payer: BC Managed Care – PPO | Admitting: Internal Medicine

## 2023-07-18 VITALS — Ht 59.0 in | Wt 313.0 lb

## 2023-07-18 DIAGNOSIS — R319 Hematuria, unspecified: Secondary | ICD-10-CM | POA: Diagnosis not present

## 2023-07-18 DIAGNOSIS — Z124 Encounter for screening for malignant neoplasm of cervix: Secondary | ICD-10-CM

## 2023-07-18 DIAGNOSIS — Z853 Personal history of malignant neoplasm of breast: Secondary | ICD-10-CM

## 2023-07-18 DIAGNOSIS — K219 Gastro-esophageal reflux disease without esophagitis: Secondary | ICD-10-CM

## 2023-07-18 DIAGNOSIS — Z1211 Encounter for screening for malignant neoplasm of colon: Secondary | ICD-10-CM

## 2023-07-18 DIAGNOSIS — F419 Anxiety disorder, unspecified: Secondary | ICD-10-CM

## 2023-07-18 DIAGNOSIS — D508 Other iron deficiency anemias: Secondary | ICD-10-CM

## 2023-07-18 DIAGNOSIS — R0683 Snoring: Secondary | ICD-10-CM | POA: Insufficient documentation

## 2023-07-18 DIAGNOSIS — F439 Reaction to severe stress, unspecified: Secondary | ICD-10-CM

## 2023-07-18 DIAGNOSIS — Z13220 Encounter for screening for lipoid disorders: Secondary | ICD-10-CM

## 2023-07-18 DIAGNOSIS — N949 Unspecified condition associated with female genital organs and menstrual cycle: Secondary | ICD-10-CM

## 2023-07-18 DIAGNOSIS — E236 Other disorders of pituitary gland: Secondary | ICD-10-CM

## 2023-07-18 NOTE — Progress Notes (Deleted)
Subjective:    Patient ID: Brittney Nash, female    DOB: Jun 20, 1976, 48 y.o.   MRN: [id number]  Patient here for No chief complaint on file.   HPI Here for a scheduled follow up - follow up regarding - history of breast cancer, stress/anxiety and GERD. Saw GI 04/19/23 - evaluation IDA. Recommended EGD and colonoscopy. Colonoscopy 07/11/23 - two 3-31mm polyps in the descending colon. EGD - gastritis. Saw Dr Sherryll Burger 05/09/23 - f/u CT showing partially empty sella - felt to be likely anatomical variant.  Needs f/u urinalysis to confirm blood clear.   Past Medical History:  Diagnosis Date   Arthritis 2013   Breast cancer (HCC) 08/26/2016   History of femur fracture 2004   s/p surgery with rod placement   Past Surgical History:  Procedure Laterality Date   BIOPSY  07/11/2023   Procedure: BIOPSY;  Surgeon: Jaynie Collins, DO;  Location: [facility] ENDOSCOPY;  Service: Gastroenterology;;   BREAST LUMPECTOMY Left 11/02/2016   COLONOSCOPY WITH PROPOFOL N/A 07/11/2023   Procedure: COLONOSCOPY WITH PROPOFOL;  Surgeon: Jaynie Collins, DO;  Location[facility] ENDOSCOPY;  Service: Gastroenterology;  Laterality: N/A;   ESOPHAGOGASTRODUODENOSCOPY (EGD) WITH PROPOFOL N/A 07/11/2023   Procedure: ESOPHAGOGASTRODUODENOSCOPY (EGD) WITH PROPOFOL;  Surgeon: Jaynie Collins, DO;  Location: [facility] ENDOSCOPY;  Service: Gastroenterology;  Laterality: N/A;   LEG SURGERY Left    POLYPECTOMY  07/11/2023   Procedure: POLYPECTOMY;  Surgeon: Jaynie Collins, DO;  Location: [facility] ENDOSCOPY;  Service: Gastroenterology;;   Family History  Problem Relation Age of Onset   Arthritis Mother    Heart disease Paternal Grandfather    Social History   Socioeconomic History   Marital status: Single    Spouse name: Not on file   Number of children: Not on file   Years of education: Not on file   Highest education level: Not on file  Occupational History   Not on file  Tobacco Use   Smoking status: Never    Smokeless tobacco: Never  Vaping Use   Vaping status: Never Used  Substance and Sexual Activity   Alcohol use: Yes    Comment: occ 1-2 times a month    Drug use: No   Sexual activity: Not Currently    Birth control/protection: None  Other Topics Concern   Not on file  Social History Narrative   Not on file   Social Drivers of Health   Financial Resource Strain: Not on file  Food Insecurity: Not on file  Transportation Needs: Not on file  Physical Activity: Not on file  Stress: Not on file  Social Connections: Not on file     Review of Systems     Objective:     There were no vitals taken for this visit. Wt Readings from Last 3 Encounters:  07/11/23 (!) 313 lb 12.8 oz (142.3 kg)  03/16/23 (!) 312 lb (141.5 kg)  02/21/23 (!) 311 lb (141.1 kg)    Physical Exam  {Perform Simple Foot Exam  Perform Detailed exam:1} {Insert foot Exam (Optional):30965}   Outpatient Encounter Medications as of 07/18/2023  Medication Sig   cyanocobalamin 1000 MCG tablet Take 1,000 mcg by mouth daily.   cyclobenzaprine (FLEXERIL) 5 MG tablet Take 1 tablet (5 mg total) by mouth at bedtime as needed for muscle spasms.   escitalopram (LEXAPRO) 20 MG tablet Take 1 tablet by mouth once daily   ferrous sulfate 325 (65 FE) MG tablet Take 325 mg by mouth daily with  breakfast.   No facility-administered encounter medications on file as of 07/18/2023.     Lab Results  Component Value Date   WBC 5.2 02/21/2023   HGB 11.5 (L) 02/21/2023   HCT 36.5 02/21/2023   PLT 321 02/21/2023   GLUCOSE 89 03/16/2023   CHOL 142 03/16/2023   TRIG 63.0 03/16/2023   HDL 49.30 03/16/2023   LDLCALC 80 03/16/2023   ALT 13 03/16/2023   AST 13 03/16/2023   NA 136 03/16/2023   K 4.3 03/16/2023   CL 102 03/16/2023   CREATININE 0.71 03/16/2023   BUN 10 03/16/2023   CO2 30 03/16/2023   TSH 1.33 03/16/2023   INR 1.1 09/02/2011    No results found.     Assessment & Plan:  There are no diagnoses linked to  this encounter.   Dale Chase, MD

## 2023-07-18 NOTE — Assessment & Plan Note (Signed)
Appears to be doing well on lexapro.  Follow.  ?

## 2023-07-18 NOTE — Assessment & Plan Note (Signed)
Acid reflux as outlined.  Take omeprazole daily. Follow.  See if can suppress acid reflux.

## 2023-07-18 NOTE — Assessment & Plan Note (Signed)
Had noted left adnexal cyst on previous MRI 07/2018.  Saw gyn.  Had f/u pelvic ultrasound 08/2018.  Continued to f/u with gyn throughout her pregnancy in 2020.  She is planning to schedule a f/u with gyn - Dr Feliberto Gottron. Needs f/u scheduled. Discussed again today. Agreeable for referral to gyn.

## 2023-07-18 NOTE — Assessment & Plan Note (Signed)
Seeing oncology. F/u 01/2023 - continue oral iron.  Follow up cbc and iron studies.

## 2023-07-18 NOTE — Assessment & Plan Note (Signed)
Does report snoring.  Also wakes up not feeling rested.  Concern raised during her procedure regarding sleep apnea.  Agreeable for pulmonary referral for evaluation for possible sleep apnea.

## 2023-07-18 NOTE — Assessment & Plan Note (Signed)
Saw neurology - recommend observation.

## 2023-07-18 NOTE — Assessment & Plan Note (Signed)
Colonoscopy 07/11/23 - two 3-55mm polyps in the descending colon.

## 2023-07-18 NOTE — Telephone Encounter (Signed)
Dr. Dale Avalon would like for patient to be scheduled for a fasting lab visit in 1-2 weeks and a 44-month follow-up appointment with her.  I called patient and her voice mailbox has not been set up yet.  I sent a letter to patient via MyChart, asking her to please call us to schedule these appointments.

## 2023-07-18 NOTE — Progress Notes (Signed)
Patient ID: ZYNAE Brittney Nash, female   DOB: 25-Nov-1975, 48 y.o.   MRN: [id number]   Virtual Visit via vieeo Note  I connected with Brittney Nash by a video enabled telemedicine application and verified that I am speaking with the correct person using two identifiers. Location patient: home Location provider: work Persons participating in the virtual visit: patient, provider  The limitations, risks, security and privacy concerns of performing an evaluation and management service by video and the availability of in person appointments have been discussed. It has also been discussed with the patient that there may be a patient responsible charge related to this service. The patient expressed understanding and agreed to proceed.   Reason for visit: follow up appt  HPI: Here for a scheduled follow up - follow up regarding - history of breast cancer, stress/anxiety and GERD. Saw GI 04/19/23 - evaluation IDA. Recommended EGD and colonoscopy. Colonoscopy 07/11/23 - two 3-78mm polyps in the descending colon. EGD - gastritis. Reports some acid reflux. Taking omeprazole prn. Discussed taking daily and updating me if persistent symptoms. Saw Dr Sherryll Burger 05/09/23 - f/u CT showing partially empty sella - felt to be likely anatomical variant. Had vaginal spotting starting 07/08/23 - 07/16/23.  No bleeding since. Prior to this, periods had been regular. Discussed keeping menstrual diary. This was the time for her regular period. She did have a negative pregnancy test. Has been having normal bowel movements recently. Does report snoring.  Also wakes up not feeling rested.  Concern raised during her procedure regarding sleep apnea.  Agreeable for pulmonary referral for evaluation for possible sleep apnea. Has f/u planned with oncology 07/2023.  Needs f/u urinalysis to confirm blood clear.    ROS: See pertinent positives and negatives per HPI.  Past Medical History:  Diagnosis Date   Arthritis 2013   Breast cancer (HCC)  08/26/2016   History of femur fracture 2004   s/p surgery with rod placement    Past Surgical History:  Procedure Laterality Date   BIOPSY  07/11/2023   Procedure: BIOPSY;  Surgeon: Jaynie Collins, DO;  Location: [facility] ENDOSCOPY;  Service: Gastroenterology;;   BREAST LUMPECTOMY Left 11/02/2016   COLONOSCOPY WITH PROPOFOL N/A 07/11/2023   Procedure: COLONOSCOPY WITH PROPOFOL;  Surgeon: Jaynie Collins, DO;  Location: [facility] ENDOSCOPY;  Service: Gastroenterology;  Laterality: N/A;   ESOPHAGOGASTRODUODENOSCOPY (EGD) WITH PROPOFOL N/A 07/11/2023   Procedure: ESOPHAGOGASTRODUODENOSCOPY (EGD) WITH PROPOFOL;  Surgeon: Jaynie Collins, DO;  Location: [facility] ENDOSCOPY;  Service: Gastroenterology;  Laterality: N/A;   LEG SURGERY Left    POLYPECTOMY  07/11/2023   Procedure: POLYPECTOMY;  Surgeon: Jaynie Collins, DO;  Location[facility] ENDOSCOPY;  Service: Gastroenterology;;    Family History  Problem Relation Age of Onset   Arthritis Mother    Heart disease Paternal Grandfather     SOCIAL HX: reviewed.    Current Outpatient Medications:    cyanocobalamin 1000 MCG tablet, Take 1,000 mcg by mouth daily., Disp: , Rfl:    cyclobenzaprine (FLEXERIL) 5 MG tablet, Take 1 tablet (5 mg total) by mouth at bedtime as needed for muscle spasms., Disp: 30 tablet, Rfl: 0   escitalopram (LEXAPRO) 20 MG tablet, Take 1 tablet by mouth once daily, Disp: 90 tablet, Rfl: 1   ferrous sulfate 325 (65 FE) MG tablet, Take 325 mg by mouth daily with breakfast., Disp: , Rfl:   EXAM:  GENERAL: alert, oriented, appears well and in no acute distress  HEENT: atraumatic, conjunttiva clear, no obvious abnormalities on  inspection of external nose and ears  NECK: normal movements of the head and neck  LUNGS: on inspection no signs of respiratory distress, breathing rate appears normal, no obvious gross SOB, gasping or wheezing  CV: no obvious cyanosis  PSYCH/NEURO: pleasant and cooperative, no obvious  depression or anxiety, speech and thought processing grossly intact  ASSESSMENT AND PLAN:  Discussed the following assessment and plan:  Problem List Items Addressed This Visit     Stress   Appears to be doing well on lexapro.  Follow.       Snoring   Does report snoring.  Also wakes up not feeling rested.  Concern raised during her procedure regarding sleep apnea.  Agreeable for pulmonary referral for evaluation for possible sleep apnea.      Relevant Orders   Ambulatory referral to Pulmonology   Iron deficiency anemia   Seeing oncology. F/u 01/2023 - continue oral iron.  Follow up cbc and iron studies.       Relevant Orders   Basic metabolic panel   History of breast cancer   Completed tamoxifen (5 years).  May 2024 - mammogram ok.  F/u mammogram 10/2023.        Relevant Orders   Hepatic function panel   Lipid panel   VITAMIN D 25 Hydroxy (Vit-D Deficiency, Fractures)   Hematuria - Primary   Needs f/u urine to confirm clear.       Relevant Orders   Urinalysis, Routine w reflex microscopic   GERD (gastroesophageal reflux disease)   Acid reflux as outlined.  Take omeprazole daily. Follow.  See if can suppress acid reflux.       Empty sella (HCC)   Saw neurology - recommend observation.       Colon cancer screening   Colonoscopy 07/11/23 - two 3-61mm polyps in the descending colon.      Anxiety   Continue lexapro.  Overall appears to be stable.  Follow.       Adnexal cyst   Had noted left adnexal cyst on previous MRI 07/2018.  Saw gyn.  Had f/u pelvic ultrasound 08/2018.  Continued to f/u with gyn throughout her pregnancy in 2020.  She is planning to schedule a f/u with gyn - Dr Feliberto Gottron. Needs f/u scheduled. Discussed again today. Agreeable for referral to gyn.       Relevant Orders   Ambulatory referral to Gynecology   Other Visit Diagnoses       Screening cholesterol level         Cervical cancer screening       Relevant Orders   Ambulatory referral  to Gynecology       No follow-ups on file.   I discussed the assessment and treatment plan with the patient. The patient was provided an opportunity to ask questions and all were answered. The patient agreed with the plan and demonstrated an understanding of the instructions.   The patient was advised to call back or seek an in-person evaluation if the symptoms worsen or if the condition fails to improve as anticipated.    Dale Farragut, MD

## 2023-07-18 NOTE — Assessment & Plan Note (Signed)
Completed tamoxifen (5 years).  May 2024 - mammogram ok.  F/u mammogram 10/2023.

## 2023-07-18 NOTE — Assessment & Plan Note (Signed)
Needs f/u urine to confirm clear.

## 2023-07-18 NOTE — Assessment & Plan Note (Signed)
Continue lexapro.  Overall appears to be stable.  Follow.

## 2023-07-27 ENCOUNTER — Other Ambulatory Visit: Payer: Self-pay | Admitting: Internal Medicine

## 2023-07-27 DIAGNOSIS — M5432 Sciatica, left side: Secondary | ICD-10-CM

## 2023-07-27 NOTE — Telephone Encounter (Signed)
Received request for refill for flexeril. This is not a routine medication. Please call and confirm if she needs a refill on flexeril and confirm if any acute problems.

## 2023-08-14 ENCOUNTER — Other Ambulatory Visit: Payer: Self-pay | Admitting: Internal Medicine

## 2023-08-14 DIAGNOSIS — M5432 Sciatica, left side: Secondary | ICD-10-CM

## 2023-08-15 MED ORDER — CYCLOBENZAPRINE HCL 5 MG PO TABS: 5.0000 mg | ORAL_TABLET | Freq: Every evening | ORAL | 0 refills | Status: DC | PRN

## 2023-08-15 NOTE — Telephone Encounter (Signed)
 I refilled her flexeril. Needs f/u appt with me. Does not have one scheduled.

## 2023-08-24 ENCOUNTER — Inpatient Hospital Stay: Payer: BC Managed Care – PPO | Attending: Internal Medicine

## 2023-08-25 DIAGNOSIS — N83202 Unspecified ovarian cyst, left side: Secondary | ICD-10-CM | POA: Diagnosis not present

## 2023-08-25 DIAGNOSIS — Z124 Encounter for screening for malignant neoplasm of cervix: Secondary | ICD-10-CM | POA: Diagnosis not present

## 2023-08-30 ENCOUNTER — Inpatient Hospital Stay: Payer: BC Managed Care – PPO

## 2023-09-02 ENCOUNTER — Inpatient Hospital Stay

## 2023-09-02 ENCOUNTER — Inpatient Hospital Stay: Attending: Internal Medicine

## 2023-09-02 DIAGNOSIS — D509 Iron deficiency anemia, unspecified: Secondary | ICD-10-CM | POA: Insufficient documentation

## 2023-09-02 DIAGNOSIS — D508 Other iron deficiency anemias: Secondary | ICD-10-CM

## 2023-09-02 DIAGNOSIS — Z853 Personal history of malignant neoplasm of breast: Secondary | ICD-10-CM | POA: Diagnosis not present

## 2023-09-02 LAB — CBC WITH DIFFERENTIAL/PLATELET
Abs Immature Granulocytes: 0.05 10*3/uL (ref 0.00–0.07)
Basophils Absolute: 0 10*3/uL (ref 0.0–0.1)
Basophils Relative: 0 %
Eosinophils Absolute: 0.1 10*3/uL (ref 0.0–0.5)
Eosinophils Relative: 3 %
HCT: 37.7 % (ref 36.0–46.0)
Hemoglobin: 12 g/dL (ref 12.0–15.0)
Immature Granulocytes: 1 %
Lymphocytes Relative: 36 %
Lymphs Abs: 1.8 10*3/uL (ref 0.7–4.0)
MCH: 26.5 pg (ref 26.0–34.0)
MCHC: 31.8 g/dL (ref 30.0–36.0)
MCV: 83.2 fL (ref 80.0–100.0)
Monocytes Absolute: 0.5 10*3/uL (ref 0.1–1.0)
Monocytes Relative: 9 %
Neutro Abs: 2.6 10*3/uL (ref 1.7–7.7)
Neutrophils Relative %: 51 %
Platelets: 284 10*3/uL (ref 150–400)
RBC: 4.53 MIL/uL (ref 3.87–5.11)
RDW: 13.8 % (ref 11.5–15.5)
WBC: 5 10*3/uL (ref 4.0–10.5)
nRBC: 0 % (ref 0.0–0.2)

## 2023-09-02 LAB — FERRITIN: Ferritin: 23 ng/mL (ref 11–307)

## 2023-09-02 LAB — IRON AND TIBC
Iron: 45 ug/dL (ref 28–170)
Saturation Ratios: 10 % — ABNORMAL LOW (ref 10.4–31.8)
TIBC: 452 ug/dL — ABNORMAL HIGH (ref 250–450)
UIBC: 407 ug/dL

## 2023-09-12 DIAGNOSIS — N83202 Unspecified ovarian cyst, left side: Secondary | ICD-10-CM | POA: Diagnosis not present

## 2023-10-31 DIAGNOSIS — B081 Molluscum contagiosum: Secondary | ICD-10-CM | POA: Diagnosis not present

## 2023-10-31 DIAGNOSIS — D485 Neoplasm of uncertain behavior of skin: Secondary | ICD-10-CM | POA: Diagnosis not present

## 2023-10-31 DIAGNOSIS — L400 Psoriasis vulgaris: Secondary | ICD-10-CM | POA: Diagnosis not present

## 2023-11-07 ENCOUNTER — Inpatient Hospital Stay: Admission: RE | Admit: 2023-11-07 | Payer: BC Managed Care – PPO | Source: Ambulatory Visit

## 2023-11-30 ENCOUNTER — Other Ambulatory Visit: Payer: Self-pay | Admitting: Internal Medicine

## 2024-01-16 ENCOUNTER — Ambulatory Visit: Admitting: Internal Medicine

## 2024-02-06 ENCOUNTER — Ambulatory Visit: Admitting: Internal Medicine

## 2024-02-14 ENCOUNTER — Ambulatory Visit: Admitting: Internal Medicine

## 2024-02-17 ENCOUNTER — Other Ambulatory Visit: Payer: Self-pay | Admitting: *Deleted

## 2024-02-17 DIAGNOSIS — D508 Other iron deficiency anemias: Secondary | ICD-10-CM

## 2024-02-20 ENCOUNTER — Inpatient Hospital Stay

## 2024-02-20 ENCOUNTER — Telehealth: Payer: Self-pay | Admitting: Nurse Practitioner

## 2024-02-20 ENCOUNTER — Inpatient Hospital Stay: Admitting: Nurse Practitioner

## 2024-02-20 NOTE — Telephone Encounter (Signed)
 Pt has the flu and called to r/s appts (8/26). Appts have been r/s with pt

## 2024-02-21 ENCOUNTER — Encounter: Payer: Self-pay | Admitting: Internal Medicine

## 2024-02-21 ENCOUNTER — Other Ambulatory Visit: Payer: PRIVATE HEALTH INSURANCE

## 2024-02-21 ENCOUNTER — Inpatient Hospital Stay: Payer: PRIVATE HEALTH INSURANCE | Admitting: Nurse Practitioner

## 2024-02-21 NOTE — Telephone Encounter (Signed)
 Confirm only lesion is the lip lesion pictured. Confirm no other problems. Can send in rx to try acyclovir  ointment - apply to affected area qid.

## 2024-02-22 MED ORDER — ACYCLOVIR 5 % EX OINT: 1.0000 | TOPICAL_OINTMENT | Freq: Four times a day (QID) | CUTANEOUS | 0 refills | Status: AC

## 2024-02-22 NOTE — Telephone Encounter (Signed)
 Rx sent in for acyclovir  ointment.

## 2024-02-23 NOTE — Telephone Encounter (Signed)
 Noted. Make note on schedule - wants STD testing. Also, confirm nothing more needed prior to appt

## 2024-03-01 ENCOUNTER — Other Ambulatory Visit (HOSPITAL_COMMUNITY)
Admission: RE | Admit: 2024-03-01 | Discharge: 2024-03-01 | Disposition: A | Discharge status: Home / Self Care | Source: Ambulatory Visit | Attending: Internal Medicine | Admitting: Internal Medicine

## 2024-03-01 ENCOUNTER — Inpatient Hospital Stay (HOSPITAL_BASED_OUTPATIENT_CLINIC_OR_DEPARTMENT_OTHER): Admitting: Nurse Practitioner

## 2024-03-01 ENCOUNTER — Ambulatory Visit: Admitting: Internal Medicine

## 2024-03-01 ENCOUNTER — Encounter: Payer: Self-pay | Admitting: Nurse Practitioner

## 2024-03-01 ENCOUNTER — Inpatient Hospital Stay: Attending: Oncology

## 2024-03-01 ENCOUNTER — Ambulatory Visit: Payer: Self-pay | Admitting: Oncology

## 2024-03-01 VITALS — BP 124/70 | HR 90 | Resp 16 | Ht 59.0 in | Wt 312.4 lb

## 2024-03-01 VITALS — BP 109/74 | HR 89 | Temp 98.3°F | Resp 16 | Wt 313.0 lb

## 2024-03-01 DIAGNOSIS — D508 Other iron deficiency anemias: Secondary | ICD-10-CM

## 2024-03-01 DIAGNOSIS — Z1231 Encounter for screening mammogram for malignant neoplasm of breast: Secondary | ICD-10-CM

## 2024-03-01 DIAGNOSIS — Z202 Contact with and (suspected) exposure to infections with a predominantly sexual mode of transmission: Secondary | ICD-10-CM | POA: Insufficient documentation

## 2024-03-01 DIAGNOSIS — R319 Hematuria, unspecified: Secondary | ICD-10-CM

## 2024-03-01 DIAGNOSIS — Z86000 Personal history of in-situ neoplasm of breast: Secondary | ICD-10-CM | POA: Diagnosis not present

## 2024-03-01 DIAGNOSIS — D509 Iron deficiency anemia, unspecified: Secondary | ICD-10-CM

## 2024-03-01 DIAGNOSIS — E236 Other disorders of pituitary gland: Secondary | ICD-10-CM

## 2024-03-01 DIAGNOSIS — N926 Irregular menstruation, unspecified: Secondary | ICD-10-CM | POA: Diagnosis not present

## 2024-03-01 DIAGNOSIS — E611 Iron deficiency: Secondary | ICD-10-CM | POA: Diagnosis not present

## 2024-03-01 DIAGNOSIS — Z853 Personal history of malignant neoplasm of breast: Secondary | ICD-10-CM | POA: Diagnosis not present

## 2024-03-01 DIAGNOSIS — F439 Reaction to severe stress, unspecified: Secondary | ICD-10-CM

## 2024-03-01 DIAGNOSIS — F419 Anxiety disorder, unspecified: Secondary | ICD-10-CM

## 2024-03-01 DIAGNOSIS — Z113 Encounter for screening for infections with a predominantly sexual mode of transmission: Secondary | ICD-10-CM | POA: Insufficient documentation

## 2024-03-01 DIAGNOSIS — C50912 Malignant neoplasm of unspecified site of left female breast: Secondary | ICD-10-CM | POA: Diagnosis not present

## 2024-03-01 DIAGNOSIS — N760 Acute vaginitis: Secondary | ICD-10-CM | POA: Insufficient documentation

## 2024-03-01 DIAGNOSIS — Z1211 Encounter for screening for malignant neoplasm of colon: Secondary | ICD-10-CM | POA: Diagnosis not present

## 2024-03-01 DIAGNOSIS — N949 Unspecified condition associated with female genital organs and menstrual cycle: Secondary | ICD-10-CM

## 2024-03-01 DIAGNOSIS — Z08 Encounter for follow-up examination after completed treatment for malignant neoplasm: Secondary | ICD-10-CM | POA: Diagnosis not present

## 2024-03-01 DIAGNOSIS — K219 Gastro-esophageal reflux disease without esophagitis: Secondary | ICD-10-CM

## 2024-03-01 LAB — CBC WITH DIFFERENTIAL/PLATELET
Abs Immature Granulocytes: 0.03 K/uL (ref 0.00–0.07)
Basophils Absolute: 0 K/uL (ref 0.0–0.1)
Basophils Absolute: 0 K/uL (ref 0.0–0.1)
Basophils Relative: 0.5 % (ref 0.0–3.0)
Basophils Relative: 0 %
Eosinophils Absolute: 0.1 K/uL (ref 0.0–0.7)
Eosinophils Absolute: 0.2 K/uL (ref 0.0–0.5)
Eosinophils Relative: 2.2 % (ref 0.0–5.0)
Eosinophils Relative: 3 %
HCT: 36.2 % (ref 36.0–46.0)
HCT: 36.3 % (ref 36.0–46.0)
Hemoglobin: 12 g/dL (ref 12.0–15.0)
Hemoglobin: 11.7 g/dL — ABNORMAL LOW (ref 12.0–15.0)
Immature Granulocytes: 1 %
Lymphocytes Relative: 31.7 % (ref 12.0–46.0)
Lymphocytes Relative: 34 %
Lymphs Abs: 1.8 K/uL (ref 0.7–4.0)
Lymphs Abs: 2 K/uL (ref 0.7–4.0)
MCH: 27.5 pg (ref 26.0–34.0)
MCHC: 33.1 g/dL (ref 30.0–36.0)
MCHC: 32.2 g/dL (ref 30.0–36.0)
MCV: 83.3 fl (ref 78.0–100.0)
MCV: 85.4 fL (ref 80.0–100.0)
Monocytes Absolute: 0.4 K/uL (ref 0.1–1.0)
Monocytes Absolute: 0.3 K/uL (ref 0.1–1.0)
Monocytes Relative: 6.3 % (ref 3.0–12.0)
Monocytes Relative: 6 %
Neutro Abs: 3.3 K/uL (ref 1.4–7.7)
Neutro Abs: 3.4 K/uL (ref 1.7–7.7)
Neutrophils Relative %: 59.3 % (ref 43.0–77.0)
Neutrophils Relative %: 56 %
Platelets: 272 K/uL (ref 150.0–400.0)
Platelets: 279 K/uL (ref 150–400)
RBC: 4.34 Mil/uL (ref 3.87–5.11)
RBC: 4.25 MIL/uL (ref 3.87–5.11)
RDW: 14.4 % (ref 11.5–15.5)
RDW: 13.2 % (ref 11.5–15.5)
WBC: 5.6 K/uL (ref 4.0–10.5)
WBC: 6 K/uL (ref 4.0–10.5)
nRBC: 0 % (ref 0.0–0.2)

## 2024-03-01 LAB — LIPID PANEL
Cholesterol: 149 mg/dL (ref 0–200)
HDL: 50.9 mg/dL (ref >39.00)
LDL Cholesterol: 85 mg/dL (ref 0–99)
NonHDL: 98.4
Total CHOL/HDL Ratio: 3
Triglycerides: 69 mg/dL (ref 0.0–149.0)
VLDL: 13.8 mg/dL (ref 0.0–40.0)

## 2024-03-01 LAB — BASIC METABOLIC PANEL WITH GFR
BUN: 20 mg/dL (ref 6–23)
CO2: 28 meq/L (ref 19–32)
Calcium: 9.2 mg/dL (ref 8.4–10.5)
Chloride: 103 meq/L (ref 96–112)
Creatinine, Ser: 0.87 mg/dL (ref 0.40–1.20)
GFR: 78.88 mL/min (ref >60.00)
Glucose, Bld: 90 mg/dL (ref 70–99)
Potassium: 4.2 meq/L (ref 3.5–5.1)
Sodium: 141 meq/L (ref 135–145)

## 2024-03-01 LAB — IRON AND TIBC
Iron: 37 ug/dL (ref 28–170)
Saturation Ratios: 9 % — ABNORMAL LOW (ref 10.4–31.8)
TIBC: 406 ug/dL (ref 250–450)
UIBC: 369 ug/dL

## 2024-03-01 LAB — HEPATIC FUNCTION PANEL
ALT: 12 U/L (ref 0–35)
AST: 13 U/L (ref 0–37)
Albumin: 4 g/dL (ref 3.5–5.2)
Alkaline Phosphatase: 86 U/L (ref 39–117)
Bilirubin, Direct: 0.1 mg/dL (ref 0.0–0.3)
Total Bilirubin: 0.7 mg/dL (ref 0.2–1.2)
Total Protein: 7.8 g/dL (ref 6.0–8.3)

## 2024-03-01 LAB — FERRITIN: Ferritin: 22 ng/mL (ref 11–307)

## 2024-03-01 LAB — IBC + FERRITIN
Ferritin: 25.5 ng/mL (ref 10.0–291.0)
Iron: 33 ug/dL — ABNORMAL LOW (ref 42–145)
Saturation Ratios: 7.8 % — ABNORMAL LOW (ref 20.0–50.0)
TIBC: 421.4 ug/dL (ref 250.0–450.0)
Transferrin: 301 mg/dL (ref 212.0–360.0)

## 2024-03-01 LAB — HCG, QUANTITATIVE, PREGNANCY: Quantitative HCG: <0.6 m[IU]/mL

## 2024-03-01 LAB — VITAMIN D 25 HYDROXY (VIT D DEFICIENCY, FRACTURES): VITD: 18.85 ng/mL — ABNORMAL LOW (ref 30.00–100.00)

## 2024-03-01 NOTE — Patient Instructions (Signed)
 YOUR MAMMOGRAM IS DUE, PLEASE CALL AND GET THIS SCHEDULED! [facility] - call [phone]

## 2024-03-01 NOTE — Progress Notes (Signed)
 [facility] CONSULT NOTE  Patient Care Team: Glendia Shad, MD as PCP - General (Internal Medicine) Melanee Annah BROCKS, MD as Consulting Physician (Oncology)  REFERRING PROVIDER: Self referred  REASON FOR REFFERAL: History of left breast IDC Dx March 2018.   CANCER STAGING   Cancer Staging  Breast cancer (HCC) Staging form: Breast, AJCC 8th Edition - Pathologic: Stage IA (pT1c, pN0(sn), cM0, G1, ER+, PR+, HER2-, Oncotype DX score: 15) - Signed by Agrawal, Kavita, MD on 10/22/2022 Stage prefix: Initial diagnosis Method of lymph node assessment: Sentinel lymph node biopsy Multigene prognostic tests performed: Oncotype DX Recurrence score range: Greater than or equal to 11 Histologic grading system: 3 grade system  HISTORY OF PRESENTING ILLNESS:  Brittney Nash 48 y.o. female with pmh of with self-detected stage IA (pT1c, pN0(sn), cM0), grade 1, ER 91-100%, PR 81-90%, HER 2 negative (0) IDC of the left breast status post left PM and SLNB with clear margins and 0/5 LN on 11/02/16 and adjuvant radiation 12/14/16-01/11/17. Her Oncotype DX recurrence score was 15 with a 10-year risk of disease recurrence with tam alone of 10%.   Oncology history- 09/09/2016 - Presenting Symptoms Palpable L breast mass. Dx MMG: L breast 1 cm irregular mass with associated calcs in central breast. An asymmetry in retroareolar R breast effaces.   Ultrasound: L breast 1 cm oval, circumscribed, hypoechoic mass in the central breast.  09/15/2016 Biopsy L breast USG core biopsy at [facility]: IDC, G1, ER+(100%), PR+(90%), HER2-(0).  11/02/2016 Surgery L PM with clear margins and SLNB (0/5 LN); IDC with DCIS, ER 91-100%, PR 81-100%, Her2neu negative, invasive carcinoma 16mm in greatest dimension  11/19/2016 Genetics Oncotype recurrence score result 15 with 10 year risk of distant recurrence with Tam alone 10%.  12/14/2016 - 01/11/2017 Radiation Whole breast RT 267 cGy x 16 fx = 4272 cGy Boost 250 cGy x 4 fx  = 1000 cGy Total dose: 5272 cGy  Interval history: Brittney Nash is a 48 y.o. female who returns to clinic for routine follow up of iron deficiency and breast cancer surveillance. She missed her mammogram appointment. She continues to have heavy menstrual periods that are irregular. Denies unintentional weight loss. No breast masses or skin changes. No headaches or bone pain. Denies any neurologic complaints. Denies recent fevers or illnesses. Denies any easy bleeding or bruising. No melena or hematochezia. No pica or restless leg. Reports good appetite. Denies chest pain. Denies any nausea, vomiting, constipation, or diarrhea. Denies urinary complaints. Patient offers no further specific complaints today.  MEDICAL HISTORY:  Past Medical History:  Diagnosis Date   Arthritis 2013   Breast cancer (HCC) 08/26/2016   History of femur fracture 2004   s/p surgery with rod placement   SURGICAL HISTORY: Past Surgical History:  Procedure Laterality Date   BIOPSY  07/11/2023   Procedure: BIOPSY;  Surgeon: Onita Elspeth Sharper, DO;  Location: [facility] ENDOSCOPY;  Service: Gastroenterology;;   BREAST LUMPECTOMY Left 11/02/2016   COLONOSCOPY WITH PROPOFOL  N/A 07/11/2023   Procedure: COLONOSCOPY WITH PROPOFOL ;  Surgeon: Onita Elspeth Sharper, DO;  Location: [facility] ENDOSCOPY;  Service: Gastroenterology;  Laterality: N/A;   ESOPHAGOGASTRODUODENOSCOPY (EGD) WITH PROPOFOL  N/A 07/11/2023   Procedure: ESOPHAGOGASTRODUODENOSCOPY (EGD) WITH PROPOFOL ;  Surgeon: Onita Elspeth Sharper, DO;  Location: [facility] ENDOSCOPY;  Service: Gastroenterology;  Laterality: N/A;   LEG SURGERY Left    POLYPECTOMY  07/11/2023   Procedure: POLYPECTOMY;  Surgeon: Onita Elspeth Sharper, DO;  Location: [facility] ENDOSCOPY;  Service: Gastroenterology;;   SOCIAL HISTORY:  Social History   Socioeconomic History   Marital status: Single    Spouse name: Not on file   Number of children: Not on file   Years of education: Not on file   Highest  education level: Not on file  Occupational History   Not on file  Tobacco Use   Smoking status: Never   Smokeless tobacco: Never  Vaping Use   Vaping status: Never Used  Substance and Sexual Activity   Alcohol use: Yes    Comment: occ 1-2 times a month    Drug use: No   Sexual activity: Not Currently    Birth control/protection: None  Other Topics Concern   Not on file  Social History Narrative   Not on file   Social Drivers of Health   Financial Resource Strain: Low Risk  (08/25/2023)   Received from [facility]   Overall Financial Resource Strain (CARDIA)    Difficulty of Paying Living Expenses: Not hard at all  Food Insecurity: No Food Insecurity (08/25/2023)   Received from [facility]   Hunger Vital Sign    Within the past 12 months, you worried that your food would run out before you got the money to buy more.: Never true    Within the past 12 months, the food you bought just didn't last and you didn't have money to get more.: Never true  Transportation Needs: No Transportation Needs (08/25/2023)   Received from [facility] - Transportation    In the past 12 months, has lack of transportation kept you from medical appointments or from getting medications?: No    Lack of Transportation (Non-Medical): No  Physical Activity: Not on file  Stress: Not on file  Social Connections: Not on file  Intimate Partner Violence: Not on file   FAMILY HISTORY: Family History  Problem Relation Age of Onset   Arthritis Mother    Heart disease Paternal Grandfather    ALLERGIES:  has no known allergies.  MEDICATIONS:  Current Outpatient Medications  Medication Sig Dispense Refill   acyclovir  ointment (ZOVIRAX ) 5 % Apply 1 Application topically every 6 (six) hours. 30 g 0   cyanocobalamin  1000 MCG tablet Take 1,000 mcg by mouth daily.     cyclobenzaprine  (FLEXERIL ) 5 MG tablet Take 1 tablet (5 mg total) by mouth at  bedtime as needed for muscle spasms. 30 tablet 0   escitalopram  (LEXAPRO ) 20 MG tablet Take 1 tablet by mouth once daily 90 tablet 0   ferrous sulfate 325 (65 FE) MG tablet Take 325 mg by mouth daily with breakfast.     No current facility-administered medications for this visit.    REVIEW OF SYSTEMS:   Review of Systems  Constitutional:  Positive for malaise/fatigue. Negative for chills, fever and weight loss.  HENT:  Negative for hearing loss, nosebleeds, sore throat and tinnitus.   Eyes:  Negative for blurred vision and double vision.  Respiratory:  Negative for cough, hemoptysis, shortness of breath and wheezing.   Cardiovascular:  Negative for chest pain, palpitations and leg swelling.  Gastrointestinal:  Negative for abdominal pain, blood in stool, constipation, diarrhea, melena, nausea and vomiting.  Genitourinary:  Negative for dysuria and urgency.  Musculoskeletal:  Negative for back pain, falls, joint pain and myalgias.  Skin:  Negative for itching and rash.  Neurological:  Negative for dizziness, tingling, sensory change, loss of consciousness, weakness and headaches.  Endo/Heme/Allergies:  Negative for environmental allergies. Does  not bruise/bleed easily.  Psychiatric/Behavioral:  Negative for depression. The patient is not nervous/anxious and does not have insomnia.   All other systems were reviewed with the patient and are negative.  PHYSICAL EXAMINATION: ECOG PERFORMANCE STATUS: 0 - Asymptomatic  Vitals:   03/01/24 1002  BP: 109/74  Pulse: 89  Resp: 16  Temp: 98.3 F (36.8 C)  SpO2: 98%   Filed Weights   03/01/24 1002  Weight: (!) 313 lb (142 kg)   Physical Exam Vitals reviewed.  Constitutional:      Appearance: She is not ill-appearing.  HENT:     Head: Normocephalic.  Eyes:     General: No scleral icterus. Cardiovascular:     Rate and Rhythm: Normal rate and regular rhythm.  Pulmonary:     Breath sounds: Normal breath sounds.  Abdominal:      General: There is no distension.     Tenderness: There is no abdominal tenderness. There is no guarding.  Musculoskeletal:        General: No deformity.  Skin:    General: Skin is warm and dry.     Coloration: Skin is not pale.  Neurological:     Mental Status: She is alert and oriented to person, place, and time.  Psychiatric:        Mood and Affect: Mood normal.        Behavior: Behavior normal.    LABORATORY DATA:  I have reviewed the data as listed Lab Results  Component Value Date   WBC 6.0 03/01/2024   HGB 11.7 (L) 03/01/2024   HCT 36.3 03/01/2024   MCV 85.4 03/01/2024   PLT 279 03/01/2024   Recent Labs    03/16/23 1311  NA 136  K 4.3  CL 102  CO2 30  GLUCOSE 89  BUN 10  CREATININE 0.71  CALCIUM 9.5  PROT 7.8  ALBUMIN 4.0  AST 13  ALT 13  ALKPHOS 102  BILITOT 1.2  BILIDIR 0.2   Iron/TIBC/Ferritin/ %Sat    Component Value Date/Time   IRON 37 03/01/2024 0941   TIBC 406 03/01/2024 0941   FERRITIN 22 03/01/2024 0941   IRONPCTSAT 9 (L) 03/01/2024 0941    RADIOGRAPHIC STUDIES: I have personally reviewed the radiological images as listed and agreed with the findings in the report. No results found.  ASSESSMENT & PLAN: Brittney Nash is a 48 y.o. female who returns to clinic for follow up of:   # History of left breast IDC stage IA (pT1c N0, cM0) ER/PR positive, HER2 negative - self-detected stage IA (pT1c, pN0(sn), cM0), grade 1, ER 91-100%, PR 81-90%, HER 2 negative (0) IDC of the left breast status post left PM and SLNB with clear margins and 0/5 LN on 11/02/16 and adjuvant radiation 12/14/16-01/11/17. Her Oncotype DX recurrence score was 15 with a 10-year risk of disease recurrence with tam alone of 10%.  No benefit from chemotherapy.  - Completed whole breast RT from 12/14/2016 to 01/11/2017 total dose 527 2 cGy  - She completed 5 years of tamoxifen around March 2023.  Had all her care at [facility].  Was following with Dr. Jolly.  Genetic testing was done  with [facility] and was negative.  - Mammogram from May 2024 was negative.  Recommend annual mammogram which was missed. We will reschedule today. Clinically asymptomatic of recurrence.  Discussed plan to repeat mammogram annually.  # Iron deficiency anemia -- CBC showed hemoglobin 11.5 unchanged from April 2024.  Iron panel is slowly improved-  Ferritin 25 and saturation 12%.   - previously referred to Dr Aundria but missed her appointment. Saw [facility] [employer], on 04/19/23, note reviewed.  She underwent colonoscopy and endoscopy in January 2025.  Gastritis on EGD.  Granular mucosa in the greater curvature of the gastric body that was biopsied.  Negative for H. pylori, metaplasia, dysplasia, or malignancy.  1 hyperplastic polyp.  -Followed by Dr Erman- continues to have menses  - Today, Hemoglobin 11.7, ferritin 22, iron saturation 9%.  Ongoing iron deficiency.  She has previously declined IV iron.  She continues oral iron.  We discussed option of IV iron again today but she prefers to hold off.  She will take her oral iron more consistently.  We will plan to recheck her labs in 6 months.  We discussed symptoms that would be concerning for worsening anemia and warrant sooner return.   # Cervical lymphadenopathy - US  STN (08/11/2022) -done for right parotid pain.  Was treated with 7 days of antibiotics.  Showed multiple prominent lymph nodes in the region of tenderness largest measuring 2.2 x 0.8 x 1.2 cm cortex thickened up to 7 mm.  Could be inflammatory/infectious or neoplastic  -CT STN from May 2024 was reviewed with no suspicious lymphadenopathy noted.  Disposition: Mammogram (previously ordered but not performed) 6 mo- lab (CBC, ferritin, iron stores) 1 year- lab (CBC, ferritin, iron stores), Dr Melanee- la    All questions were answered. The patient knows to call the clinic with any problems, questions or concerns. No barriers to learning was detected.  Tinnie Dawn, DNP, AGNP-C,  AOCNP [facility] [phone] (clinic)

## 2024-03-01 NOTE — Assessment & Plan Note (Signed)
 Colonoscopy 07/11/23 - two 3-55mm polyps in the descending colon.

## 2024-03-01 NOTE — Progress Notes (Signed)
 Subjective:    Patient ID: Brittney Nash, female    DOB: [birth year], 48 y.o.   MRN: [id number]  Patient here for  Chief Complaint  Patient presents with   Medical Management of Chronic Issues    HPI Here for follow up appt. Had called in recent with concerns regarding a cold sore. Treated with acyclovir  ointment. Requested STD testing as well. Recently evaluated by gyn 09/12/23 - f/u ovarian cyst. Ultrasound - benign functional ovarian cyst. No intervention needed. Saw GI 04/19/23 - evaluation IDA. Recommended EGD and colonoscopy. Colonoscopy 07/11/23 - two 3-86mm polyps in the descending colon. EGD - gastritis. On PPI for GERD.  Saw Dr Maree 05/09/23 - f/u CT showing partially empty sella - felt to be likely anatomical variant. Reports had unprotected sex - two weeks ago. Wants checked for STDs. Reports vaginal itching. Reports periods varying - no period in May. Two week period in 11/2023. In 12/2023 - one day bleeding/spotting. Has f/u with oncology today. Breathing stable. No increased cough or congestion. No abdominal pain or bowel change reported.     Past Medical History:  Diagnosis Date   Arthritis 2013   Breast cancer (HCC) 08/26/2016   History of femur fracture 2004   s/p surgery with rod placement   Past Surgical History:  Procedure Laterality Date   BIOPSY  07/11/2023   Procedure: BIOPSY;  Surgeon: Onita Elspeth Sharper, DO;  Location: [facility] ENDOSCOPY;  Service: Gastroenterology;;   BREAST LUMPECTOMY Left 11/02/2016   COLONOSCOPY WITH PROPOFOL  N/A 07/11/2023   Procedure: COLONOSCOPY WITH PROPOFOL ;  Surgeon: Onita Elspeth Sharper, DO;  Location[facility]OSCOPY;  Service: Gastroenterology;  Laterality: N/A;   ESOPHAGOGASTRODUODENOSCOPY (EGD) WITH PROPOFOL  N/A 07/11/2023   Procedure: ESOPHAGOGASTRODUODENOSCOPY (EGD) WITH PROPOFOL ;  Surgeon: Onita Elspeth Sharper, DO;  Location: [facility] ENDOSCOPY;  Service: Gastroenterology;  Laterality: N/A;   LEG SURGERY Left    POLYPECTOMY  07/11/2023    Procedure: POLYPECTOMY;  Surgeon: Onita Elspeth Sharper, DO;  Location: [facility] ENDOSCOPY;  Service: Gastroenterology;;   Family History  Problem Relation Age of Onset   Arthritis Mother    Heart disease Paternal Grandfather    Social History   Socioeconomic History   Marital status: Single    Spouse name: Not on file   Number of children: Not on file   Years of education: Not on file   Highest education level: Not on file  Occupational History   Not on file  Tobacco Use   Smoking status: Never   Smokeless tobacco: Never  Vaping Use   Vaping status: Never Used  Substance and Sexual Activity   Alcohol use: Yes    Comment: occ 1-2 times a month    Drug use: No   Sexual activity: Not Currently    Birth control/protection: None  Other Topics Concern   Not on file  Social History Narrative   Not on file   Social Drivers of Health   Financial Resource Strain: Low Risk  (08/25/2023)   Received from [facility]   Overall Financial Resource Strain (CARDIA)    Difficulty of Paying Living Expenses: Not hard at all  Food Insecurity: No Food Insecurity (08/25/2023)   Received from [facility]   Hunger Vital Sign    Within the past 12 months, you worried that your food would run out before you got the money to buy more.: Never true    Within the past 12 months, the food you bought just didn't last  and you didn't have money to get more.: Never true  Transportation Needs: No Transportation Needs (08/25/2023)   Received from [facility] - Transportation    In the past 12 months, has lack of transportation kept you from medical appointments or from getting medications?: No    Lack of Transportation (Non-Medical): No  Physical Activity: Not on file  Stress: Not on file  Social Connections: Not on file     Review of Systems  Constitutional:  Negative for appetite change and unexpected weight change.  HENT:  Negative for  congestion and sinus pressure.   Respiratory:  Negative for cough, chest tightness and shortness of breath.   Cardiovascular:  Negative for chest pain and palpitations.       No increased swelling.   Gastrointestinal:  Negative for abdominal pain, diarrhea, nausea and vomiting.  Genitourinary:  Negative for difficulty urinating and dysuria.  Musculoskeletal:  Negative for joint swelling and myalgias.  Skin:  Negative for color change and rash.  Neurological:  Negative for dizziness and headaches.  Psychiatric/Behavioral:  Negative for agitation and dysphoric mood.        Objective:     BP 124/70   Pulse 90   Resp 16   Ht 4' 11 (1.499 m)   Wt (!) 312 lb 6.4 oz (141.7 kg)   SpO2 98%   BMI 63.10 kg/m  Wt Readings from Last 3 Encounters:  03/01/24 (!) 313 lb (142 kg)  03/01/24 (!) 312 lb 6.4 oz (141.7 kg)  07/18/23 (!) 313 lb (142 kg)    Physical Exam Vitals reviewed.  Constitutional:      General: She is not in acute distress.    Appearance: Normal appearance.  HENT:     Head: Normocephalic and atraumatic.     Right Ear: External ear normal.     Left Ear: External ear normal.  Eyes:     General: No scleral icterus.       Right eye: No discharge.        Left eye: No discharge.     Conjunctiva/sclera: Conjunctivae normal.  Neck:     Thyroid : No thyromegaly.  Cardiovascular:     Rate and Rhythm: Normal rate and regular rhythm.  Pulmonary:     Effort: No respiratory distress.     Breath sounds: Normal breath sounds. No wheezing.  Abdominal:     General: Bowel sounds are normal.     Palpations: Abdomen is soft.     Tenderness: There is no abdominal tenderness.  Genitourinary:    Comments: Normal external genitalia.  Vaginal vault without lesions.  Vaginal discharge present. Vaginal swab obtained. Could not appreciate any adnexal masses or tenderness.   Musculoskeletal:        General: No tenderness.     Cervical back: Neck supple. No tenderness.     Comments: No  increased swelling.   Lymphadenopathy:     Cervical: No cervical adenopathy.  Skin:    Findings: No erythema or rash.  Neurological:     Mental Status: She is alert.  Psychiatric:        Mood and Affect: Mood normal.        Behavior: Behavior normal.         Outpatient Encounter Medications as of 03/01/2024  Medication Sig   acyclovir  ointment (ZOVIRAX ) 5 % Apply 1 Application topically every 6 (six) hours.   cyanocobalamin  1000 MCG tablet Take 1,000 mcg by mouth daily.  cyclobenzaprine  (FLEXERIL ) 5 MG tablet Take 1 tablet (5 mg total) by mouth at bedtime as needed for muscle spasms.   escitalopram  (LEXAPRO ) 20 MG tablet Take 1 tablet by mouth once daily   ferrous sulfate 325 (65 FE) MG tablet Take 325 mg by mouth daily with breakfast.   No facility-administered encounter medications on file as of 03/01/2024.     Lab Results  Component Value Date   WBC 6.0 03/01/2024   HGB 11.7 (L) 03/01/2024   HCT 36.3 03/01/2024   PLT 279 03/01/2024   GLUCOSE 90 03/01/2024   CHOL 149 03/01/2024   TRIG 69.0 03/01/2024   HDL 50.90 03/01/2024   LDLCALC 85 03/01/2024   ALT 12 03/01/2024   AST 13 03/01/2024   NA 141 03/01/2024   K 4.2 03/01/2024   CL 103 03/01/2024   CREATININE 0.87 03/01/2024   BUN 20 03/01/2024   CO2 28 03/01/2024   TSH 1.33 03/16/2023   INR 1.1 09/02/2011       Assessment & Plan:  Malignant neoplasm of left female breast, unspecified estrogen receptor status, unspecified site of breast (HCC) Assessment & Plan: Completed tamoxifen (5 years).  May 2024 - mammogram ok.  F/u mammogram 10/2023. Overdue. Discussed.    Anxiety Assessment & Plan: Continue lexapro .  Stable.    Colon cancer screening Assessment & Plan: Colonoscopy 07/11/23 - two 3-17mm polyps in the descending colon.   Acute vaginitis -     Cervicovaginal ancillary only  Visit for screening mammogram -     3D Screening Mammogram, Left and Right; Future  Screen for STD (sexually transmitted  disease) Assessment & Plan: Screening tests as outlined.    Possible exposure to STD Assessment & Plan: Unprotected sex two weeks ago. Check for STDs as outlined.   Orders: -     Hepatitis C antibody -     HIV Antibody (routine testing w rflx)  History of breast cancer -     VITAMIN D  25 Hydroxy (Vit-D Deficiency, Fractures) -     Lipid panel -     Hepatic function panel  Other iron deficiency anemia Assessment & Plan: Seeing oncology. Has f/u appt today. Check labs ordered since we are drawing labs. Check cbc and iron studies.   Orders: -     Basic metabolic panel with GFR  Iron deficiency -     CBC with Differential/Platelet -     IBC + Ferritin  Menstrual irregularity Assessment & Plan: Check serum pregnancy test. Keep menstrual diary.   Orders: -     hCG, quantitative, pregnancy  Adnexal cyst Assessment & Plan: Had noted left adnexal cyst on previous MRI 07/2018.  Saw gyn.  Had f/u pelvic ultrasound 08/2018.  Continued to f/u with gyn throughout her pregnancy in 2020.  She is planning to schedule a f/u with gyn - Dr Schermerhorn.  Recently evaluated by gyn 09/12/23 - f/u ovarian cyst. Ultrasound - benign functional ovarian cyst. No intervention needed.   Empty sella [facility]) Assessment & Plan: Saw neurology - recommend observation.    Gastroesophageal reflux disease, unspecified whether esophagitis present Assessment & Plan: Continues on protonix. No upper symptoms reported.    Hematuria, unspecified type Assessment & Plan: Needs f/u urine to confirm clear.    Stress Assessment & Plan: Continue on lexapro . Follow.       Allena Hamilton, MD

## 2024-03-01 NOTE — Assessment & Plan Note (Addendum)
Continue lexapro.  Stable.  

## 2024-03-01 NOTE — Assessment & Plan Note (Addendum)
 Completed tamoxifen (5 years).  May 2024 - mammogram ok.  F/u mammogram 10/2023. Overdue. Discussed.

## 2024-03-02 ENCOUNTER — Ambulatory Visit: Payer: Self-pay | Admitting: Internal Medicine

## 2024-03-02 ENCOUNTER — Telehealth: Payer: Self-pay | Admitting: Internal Medicine

## 2024-03-02 LAB — CERVICOVAGINAL ANCILLARY ONLY
Bacterial Vaginitis (gardnerella): POSITIVE — AB
Candida Glabrata: NEGATIVE
Candida Vaginitis: POSITIVE — AB
Chlamydia: NEGATIVE
Comment: NEGATIVE
Comment: NEGATIVE
Comment: NEGATIVE
Comment: NEGATIVE
Comment: NEGATIVE
Comment: NORMAL
Neisseria Gonorrhea: NEGATIVE
Trichomonas: NEGATIVE

## 2024-03-02 LAB — HIV ANTIBODY (ROUTINE TESTING W REFLEX): HIV 1&2 Ab, 4th Generation: NONREACTIVE

## 2024-03-02 LAB — HEPATITIS C ANTIBODY: Hepatitis C Ab: NONREACTIVE

## 2024-03-02 MED ORDER — METRONIDAZOLE 0.75 % VA GEL: 1.0000 | Freq: Two times a day (BID) | VAGINAL | 0 refills | Status: AC

## 2024-03-02 MED ORDER — FLUCONAZOLE 150 MG PO TABS: 150.0000 mg | ORAL_TABLET | Freq: Once | ORAL | 0 refills | Status: AC

## 2024-03-02 NOTE — Telephone Encounter (Signed)
 Spoke to pt went over results no drug allergies, she is ok with starting rx  you can send in to pharmacy

## 2024-03-02 NOTE — Telephone Encounter (Signed)
 Notify - vaginal swab - revealed G.vag. confirm nkda. If no then recommend metrogel  (vaginal) - one applicator bid x 5 days. The swab also revealed yeast. Can send in diflucan  150mg  x 1.

## 2024-03-02 NOTE — Telephone Encounter (Signed)
 Rx sent in for metrogel  and diflucan .

## 2024-03-02 NOTE — Addendum Note (Signed)
 Addended by: GLENDIA ALLENA RAMAN on: 03/02/2024 05:11 PM   Modules accepted: Orders

## 2024-03-03 ENCOUNTER — Encounter: Payer: Self-pay | Admitting: Internal Medicine

## 2024-03-03 NOTE — Assessment & Plan Note (Signed)
 Seeing oncology. Has f/u appt today. Check labs ordered since we are drawing labs. Check cbc and iron studies.

## 2024-03-03 NOTE — Assessment & Plan Note (Signed)
Needs f/u urine to confirm clear.

## 2024-03-03 NOTE — Assessment & Plan Note (Signed)
 Check serum pregnancy test. Keep menstrual diary.

## 2024-03-03 NOTE — Assessment & Plan Note (Signed)
 Continue on lexapro . Follow.

## 2024-03-03 NOTE — Assessment & Plan Note (Signed)
 Had noted left adnexal cyst on previous MRI 07/2018.  Saw gyn.  Had f/u pelvic ultrasound 08/2018.  Continued to f/u with gyn throughout her pregnancy in 2020.  She is planning to schedule a f/u with gyn - Dr Schermerhorn.  Recently evaluated by gyn 09/12/23 - f/u ovarian cyst. Ultrasound - benign functional ovarian cyst. No intervention needed.

## 2024-03-03 NOTE — Assessment & Plan Note (Signed)
 Screening tests as outlined.

## 2024-03-03 NOTE — Assessment & Plan Note (Signed)
 Unprotected sex two weeks ago. Check for STDs as outlined.

## 2024-03-03 NOTE — Assessment & Plan Note (Signed)
 Continues on protonix. No upper symptoms reported.

## 2024-03-03 NOTE — Assessment & Plan Note (Signed)
 Saw neurology - recommend observation.

## 2024-03-05 ENCOUNTER — Other Ambulatory Visit: Payer: Self-pay | Admitting: Internal Medicine

## 2024-03-05 DIAGNOSIS — M5432 Sciatica, left side: Secondary | ICD-10-CM

## 2024-03-05 MED ORDER — VITAMIN D (ERGOCALCIFEROL) 1.25 MG (50000 UNIT) PO CAPS: 50000.0000 [IU] | ORAL_CAPSULE | ORAL | 0 refills | Status: AC

## 2024-03-13 ENCOUNTER — Encounter: Payer: Self-pay | Admitting: Internal Medicine

## 2024-03-14 NOTE — Telephone Encounter (Signed)
 Spoke to pt. Pt stated that she is still taking the mega dose of the vit D. Pt stated that she just wanted to know if there was a good multi vit she could take that would include b12, iron and vit d for once she's completed the mega dose. Pt is currently take 3 separate supplements.  Mega dose of D, B12 gummies at 5000 mcg and iron 325.   Pt states she does better with less pills and would like a good combo multivitamin

## 2024-03-14 NOTE — Telephone Encounter (Signed)
 Unable to leave voicemail, vm not set up. Called pt to get more information in regards to vitamins pt was prescribed mega dose of vit d and should currently still be taking. Okay to relay

## 2024-03-14 NOTE — Telephone Encounter (Signed)
 In reviewing, she has had issues with low vitamin D  and iron and would need more than what is in just a general multivitamin. We had previously prescribed prescription vitamin D  - see last result note.

## 2024-03-15 NOTE — Telephone Encounter (Signed)
 At this time, she needs to continue the separate vitamin D  - level was low. Also needs to continue the iron supplement. Regarding the vitamin B12 - can decrease to 1000mcg q day. (I know this is still separate supplements, but at this time - we are trying to get levels to normal range)

## 2024-03-23 ENCOUNTER — Other Ambulatory Visit: Payer: Self-pay | Admitting: Internal Medicine

## 2024-03-28 ENCOUNTER — Ambulatory Visit: Admitting: Internal Medicine

## 2024-04-03 DIAGNOSIS — L723 Sebaceous cyst: Secondary | ICD-10-CM | POA: Diagnosis not present

## 2024-04-03 DIAGNOSIS — L400 Psoriasis vulgaris: Secondary | ICD-10-CM | POA: Diagnosis not present

## 2024-04-11 ENCOUNTER — Ambulatory Visit: Admitting: Internal Medicine

## 2024-04-23 ENCOUNTER — Ambulatory Visit
Admission: RE | Admit: 2024-04-23 | Discharge: 2024-04-23 | Disposition: A | Discharge status: Home / Self Care | Source: Ambulatory Visit | Attending: Internal Medicine | Admitting: Internal Medicine

## 2024-04-23 DIAGNOSIS — Z1231 Encounter for screening mammogram for malignant neoplasm of breast: Secondary | ICD-10-CM | POA: Diagnosis not present

## 2024-05-10 ENCOUNTER — Telehealth: Payer: Self-pay | Admitting: Internal Medicine

## 2024-05-10 NOTE — Telephone Encounter (Signed)
 Unable to lm, no voice mail, MyChart message sent that 05/14/2024 appointment needs to be rescheduled.

## 2024-05-14 ENCOUNTER — Encounter: Admitting: Internal Medicine

## 2024-05-14 DIAGNOSIS — Z1231 Encounter for screening mammogram for malignant neoplasm of breast: Secondary | ICD-10-CM

## 2024-05-14 DIAGNOSIS — D508 Other iron deficiency anemias: Secondary | ICD-10-CM

## 2024-05-14 DIAGNOSIS — Z124 Encounter for screening for malignant neoplasm of cervix: Secondary | ICD-10-CM

## 2024-05-14 DIAGNOSIS — Z853 Personal history of malignant neoplasm of breast: Secondary | ICD-10-CM

## 2024-06-05 ENCOUNTER — Other Ambulatory Visit: Payer: Self-pay | Admitting: Internal Medicine

## 2024-06-07 ENCOUNTER — Telehealth: Payer: Self-pay

## 2024-06-07 NOTE — Telephone Encounter (Signed)
 Attempted to contact pt to get scheduled a follow up appt as providers lov stated a follow up around 05-31-24. Vm box has not been set up unable to leave a vm.  Refill sent as pt has been seen within the year. Phone note left for E2C2 to have pt scheduled.

## 2024-06-07 NOTE — Telephone Encounter (Signed)
 Attempted to contact pt to get scheduled a follow up appt as providers lov stated a follow up around 05-31-24. Vm box has not been set up unable to leave a vm.    E2C2 PLEASE HAVE PT SCHEDULED A FOLLOW UP APPT

## 2024-06-12 ENCOUNTER — Encounter: Payer: Self-pay | Admitting: Internal Medicine

## 2024-06-13 NOTE — Telephone Encounter (Signed)
 Please let her know that this is not something we routinely check.  Her GYN may have this documented.

## 2024-06-15 ENCOUNTER — Encounter: Payer: Self-pay | Admitting: Internal Medicine

## 2024-08-29 ENCOUNTER — Other Ambulatory Visit

## 2025-03-01 ENCOUNTER — Other Ambulatory Visit

## 2025-03-01 ENCOUNTER — Ambulatory Visit: Admitting: Oncology
